# Patient Record
Sex: Male | Born: 1970 | Race: White | Hispanic: No | Marital: Married | Smoking: Never smoker
Health system: Southern US, Community
[De-identification: ages and names within clinical notes are randomized; demographics above are authoritative.]

## PROBLEM LIST (undated history)

## (undated) DIAGNOSIS — E7800 Pure hypercholesterolemia, unspecified: Secondary | ICD-10-CM

## (undated) DIAGNOSIS — Z789 Other specified health status: Secondary | ICD-10-CM

## (undated) HISTORY — PX: VASECTOMY: SHX75

## (undated) HISTORY — DX: Pure hypercholesterolemia, unspecified: E78.00

## (undated) HISTORY — PX: HERNIA REPAIR: SHX51

## (undated) HISTORY — DX: Other specified health status: Z78.9

---

## 2016-08-26 ENCOUNTER — Ambulatory Visit (INDEPENDENT_AMBULATORY_CARE_PROVIDER_SITE_OTHER): Payer: BC Managed Care – PPO

## 2016-08-26 ENCOUNTER — Ambulatory Visit (INDEPENDENT_AMBULATORY_CARE_PROVIDER_SITE_OTHER): Payer: BC Managed Care – PPO | Admitting: Vascular Surgery

## 2016-08-26 ENCOUNTER — Encounter (INDEPENDENT_AMBULATORY_CARE_PROVIDER_SITE_OTHER): Payer: Self-pay | Admitting: Vascular Surgery

## 2016-08-26 ENCOUNTER — Other Ambulatory Visit (INDEPENDENT_AMBULATORY_CARE_PROVIDER_SITE_OTHER): Payer: Self-pay | Admitting: Vascular Surgery

## 2016-08-26 ENCOUNTER — Encounter (INDEPENDENT_AMBULATORY_CARE_PROVIDER_SITE_OTHER): Payer: Self-pay

## 2016-08-26 DIAGNOSIS — I83893 Varicose veins of bilateral lower extremities with other complications: Secondary | ICD-10-CM

## 2016-08-26 DIAGNOSIS — M7989 Other specified soft tissue disorders: Secondary | ICD-10-CM

## 2016-08-26 DIAGNOSIS — I83813 Varicose veins of bilateral lower extremities with pain: Secondary | ICD-10-CM | POA: Diagnosis not present

## 2016-08-26 NOTE — Patient Instructions (Signed)

## 2016-08-26 NOTE — Progress Notes (Signed)
Patient ID: Alex Peterson, male   DOB: 09-May-1971, 45 y.o.   MRN: [id number]  Chief Complaint  Patient presents with  . Follow-up    HPI Alex Peterson is a 45 y.o. male.  Patient returns in follow up of their venous disease.  They have done their best to comply with the prescribed conservative therapies of compression stockings, leg elevation, exercise, and still requires anti-inflammatories for discomfort and has symptoms that are persistent and bothersome on a daily basis, affecting their activities of daily living and normal activities.  The venous reflux study demonstrates bilateral Ehlert segment great saphenous vein reflux as well as large branches of the great saphenous vein with reflux.Marland Kitchen   HPI  History reviewed. No pertinent past medical history.  Past Surgical History:  Procedure Laterality Date  . HERNIA REPAIR    . VASECTOMY      Family History  Problem Relation Age of Onset  . Varicose Veins Mother   . Heart disease Father      Social History Social History  Substance Use Topics  . Smoking status: Never Smoker  . Smokeless tobacco: Never Used  . Alcohol use Yes   Married  No Known Allergies  No current outpatient prescriptions on file.   No current facility-administered medications for this visit.       REVIEW OF SYSTEMS (Negative unless checked)  Constitutional: [] Weight loss  [] Fever  [] Chills Cardiac: [] Chest pain   [] Chest pressure   [] Palpitations   [] Shortness of breath when laying flat   [] Shortness of breath at rest   [] Shortness of breath with exertion. Vascular:  [] Pain in legs with walking   [] Pain in legs at rest   [] Pain in legs when laying flat   [] Claudication   [] Pain in feet when walking  [] Pain in feet at rest  [] Pain in feet when laying flat   [] History of DVT   [] Phlebitis   [] Swelling in legs   [x] Varicose veins   [] Non-healing ulcers Pulmonary:   [] Uses home oxygen   [] Productive cough   [] Hemoptysis   [] Wheeze  [] COPD    [] Asthma Neurologic:  [] Dizziness  [] Blackouts   [] Seizures   [] History of stroke   [] History of TIA  [] Aphasia   [] Temporary blindness   [] Dysphagia   [] Weakness or numbness in arms   [] Weakness or numbness in legs Musculoskeletal:  [] Arthritis   [] Joint swelling   [] Joint pain   [] Low back pain Hematologic:  [] Easy bruising  [] Easy bleeding   [] Hypercoagulable state   [] Anemic  [] Hepatitis Gastrointestinal:  [] Blood in stool   [] Vomiting blood  [] Gastroesophageal reflux/heartburn   [] Abdominal pain Genitourinary:  [] Chronic kidney disease   [] Difficult urination  [] Frequent urination  [] Burning with urination   [] Hematuria Skin:  [] Rashes   [] Ulcers   [] Wounds Psychological:  [] History of anxiety   []  History of major depression.    Physical Exam BP 126/83   Pulse 74   Resp 17   Ht 5\' 11"  (1.803 m)   Wt 204 lb (92.5 kg)   BMI 28.45 kg/m  Gen:  WD/WN, NAD Head: [place]/AT, No temporalis wasting.  Ear/Nose/Throat: Hearing grossly intact, dentition good Eyes: Sclera non-icteric. Conjunctiva clear Neck: Supple. Trachea midline Pulmonary:  Good air movement, no use of accessory muscles, respirations not labored.  Cardiac: RRR, No JVD Vascular: Varicosities diffuse and measuring up to 5-6 mm in the right lower extremity        Varicosities diffuse and measuring up to 5-6 mm  in the left lower extremity Vessel Right Left  Radial Palpable Palpable  Ulnar Palpable Palpable  Brachial Palpable Palpable  Carotid Palpable, without bruit Palpable, without bruit  Aorta Not palpable N/A  Femoral Palpable Palpable  Popliteal Palpable Palpable  PT Palpable Palpable  DP Palpable Palpable   Gastrointestinal: soft, non-tender/non-distended. No guarding/reflex. No masses, surgical incisions, or scars. Musculoskeletal: M/S 5/5 throughout.   Trace RLE edema.  Trace LLE edema Neurologic: Sensation grossly intact in extremities.  Symmetrical.  Speech is fluent.  Psychiatric: Judgment intact, Mood &  affect appropriate for pt's clinical situation. Dermatologic: No rashes or ulcers noted.  No cellulitis or open wounds. Lymph : No Cervical, Axillary, or Inguinal lymphadenopathy.  Radiology No results found.  Labs No results found for this or any previous visit (from the past 2160 hour(s)).  Assessment/Plan:  Varicose veins of bilateral lower extremities with pain Failed conservative therapy.  See treatment plan as below   The patient has done their best to comply with conservative therapy of 20-30 mm Hg compression stockings, leg elevation, exercise, and anti-inflammatories as needed for discomfort.  Despite this, they continue to have daily and persistent symptoms from their venous disease.  A venous reflux study demonstrates bilateral Mcpherson segment great saphenous vein reflux as well as large branches of the great saphenous vein with reflux.  As such, the patient is likely to benefit from endovenous laser ablation of the great saphenous vein bilaterally. It is likely that foam sclerotherapy will be required for the large incompetent saphenous vein branches, but laser ablation should be performed first.  Risks and benefits of the procedure including bleeding, infection, recanalization, DVT, and need for further therapy for residual varicosities were discussed.  The patient voices their understanding and is agreeable to proceed with staged bilateral great saphenous vein laser ablation.     Leotis Pain 08/26/2016, 3:29 PM

## 2016-08-26 NOTE — Assessment & Plan Note (Signed)
Failed conservative therapy.  See treatment plan as below

## 2016-10-21 ENCOUNTER — Ambulatory Visit (INDEPENDENT_AMBULATORY_CARE_PROVIDER_SITE_OTHER): Payer: BC Managed Care – PPO | Admitting: Vascular Surgery

## 2016-10-21 ENCOUNTER — Encounter (INDEPENDENT_AMBULATORY_CARE_PROVIDER_SITE_OTHER): Payer: Self-pay | Admitting: Vascular Surgery

## 2016-10-21 VITALS — BP 121/74 | HR 80 | Resp 16 | Ht 71.0 in | Wt 208.0 lb

## 2016-10-21 DIAGNOSIS — I83813 Varicose veins of bilateral lower extremities with pain: Secondary | ICD-10-CM | POA: Diagnosis not present

## 2016-10-21 NOTE — Progress Notes (Signed)
The patient's left lower extremity was sterilely prepped and draped. The ultrasound machine was used to visualize the saphenous vein throughout its course. A segment in the upper calf was selected for access. The saphenous vein was accessed without difficulty using ultrasound guidance with a micro puncture needle. A micro puncture wire and sheath were then placed. A 0.018 wire was placed beyond the saphenofemoral junction through the sheath and the micro puncture sheath was removed. The 65 cm sheath was then placed over the wire and the wire and dilator were removed. The laser fiber was placed through the sheath and its tip was placed approximately 4 cm below the saphenofemoral junction. Tumescent anesthesia was then created with a dilute lidocaine solution. Laser energy was then delivered with constant withdrawal of the sheath and laser fiber. Approximately 1623 Joules of energy were delivered over a length of 43 cm using the 1470 Hz VenaCure machine at [employer]. Sterile dressings were placed. The patient tolerated the procedure well without complications.

## 2016-10-21 NOTE — Assessment & Plan Note (Signed)
Laser procedure left leg as described below.  Has right GSV EVLT scheduled for the near future.

## 2016-10-24 ENCOUNTER — Encounter (INDEPENDENT_AMBULATORY_CARE_PROVIDER_SITE_OTHER): Payer: Self-pay | Admitting: Vascular Surgery

## 2016-10-24 ENCOUNTER — Other Ambulatory Visit (INDEPENDENT_AMBULATORY_CARE_PROVIDER_SITE_OTHER): Payer: Self-pay | Admitting: Vascular Surgery

## 2016-10-24 ENCOUNTER — Ambulatory Visit (INDEPENDENT_AMBULATORY_CARE_PROVIDER_SITE_OTHER): Payer: BC Managed Care – PPO | Admitting: Vascular Surgery

## 2016-10-24 VITALS — BP 133/72 | HR 72 | Resp 16 | Ht 71.0 in | Wt 208.0 lb

## 2016-10-24 DIAGNOSIS — L97919 Non-pressure chronic ulcer of unspecified part of right lower leg with unspecified severity: Principal | ICD-10-CM

## 2016-10-24 DIAGNOSIS — I83219 Varicose veins of right lower extremity with both ulcer of unspecified site and inflammation: Secondary | ICD-10-CM

## 2016-10-24 DIAGNOSIS — L97929 Non-pressure chronic ulcer of unspecified part of left lower leg with unspecified severity: Secondary | ICD-10-CM

## 2016-10-24 DIAGNOSIS — I83813 Varicose veins of bilateral lower extremities with pain: Secondary | ICD-10-CM

## 2016-10-24 DIAGNOSIS — I83229 Varicose veins of left lower extremity with both ulcer of unspecified site and inflammation: Secondary | ICD-10-CM

## 2016-10-24 NOTE — Assessment & Plan Note (Signed)
See laser note 

## 2016-10-24 NOTE — Progress Notes (Signed)
The patient's right lower extremity was sterilely prepped and draped. The ultrasound machine was used to visualize the saphenous vein throughout its course. A segment at the knee was selected for access. The saphenous vein was accessed with minimal difficulty using ultrasound guidance with a micro puncture needle. A micro puncture wire and sheath were then placed. A 0.018 wire was placed beyond the saphenofemoral junction through the sheath and the micro puncture sheath was removed. The 65 cm sheath was then placed over the wire and the wire and dilator were removed. The laser fiber was placed through the sheath and its tip was placed approximately 4 cm below the saphenofemoral junction. Tumescent anesthesia was then created with a dilute lidocaine solution. Laser energy was then delivered with constant withdrawal of the sheath and laser fiber. Approximately 1171 Joules of energy were delivered over a length of 30 cm using the 1470 Hz VenaCure machine at [employer]. Sterile dressings were placed. The patient tolerated the procedure well without complications.

## 2016-10-28 ENCOUNTER — Ambulatory Visit (INDEPENDENT_AMBULATORY_CARE_PROVIDER_SITE_OTHER): Payer: BC Managed Care – PPO

## 2016-10-28 DIAGNOSIS — L97929 Non-pressure chronic ulcer of unspecified part of left lower leg with unspecified severity: Secondary | ICD-10-CM

## 2016-10-28 DIAGNOSIS — L97919 Non-pressure chronic ulcer of unspecified part of right lower leg with unspecified severity: Principal | ICD-10-CM

## 2016-10-28 DIAGNOSIS — I83219 Varicose veins of right lower extremity with both ulcer of unspecified site and inflammation: Secondary | ICD-10-CM | POA: Diagnosis not present

## 2016-10-28 DIAGNOSIS — I83229 Varicose veins of left lower extremity with both ulcer of unspecified site and inflammation: Secondary | ICD-10-CM

## 2016-11-28 ENCOUNTER — Ambulatory Visit (INDEPENDENT_AMBULATORY_CARE_PROVIDER_SITE_OTHER): Payer: BC Managed Care – PPO | Admitting: Vascular Surgery

## 2016-11-28 ENCOUNTER — Encounter (INDEPENDENT_AMBULATORY_CARE_PROVIDER_SITE_OTHER): Payer: Self-pay | Admitting: Vascular Surgery

## 2016-11-28 VITALS — BP 123/76 | HR 82 | Resp 16 | Ht 71.0 in | Wt 212.0 lb

## 2016-11-28 DIAGNOSIS — I83813 Varicose veins of bilateral lower extremities with pain: Secondary | ICD-10-CM | POA: Diagnosis not present

## 2016-11-28 NOTE — Progress Notes (Signed)
MRN : [id number]  Alex Peterson is a 46 y.o. (1971/08/15) male who presents with chief complaint of  Chief Complaint  Patient presents with  . Follow-up  .  History of Present Illness: Patient returns today in follow up of Venous insufficiency. He is undergone bilateral saphenous vein laser ablations with improvement but not resolution of his lower extremity symptoms. He has had a marked reduction in the size and pain of the varicose veins although significant varicosities do remain bilaterally. He had no periprocedural complications. He is about a month out from his procedure, and is doing well today.  No current outpatient prescriptions on file.   No current facility-administered medications for this visit.     History reviewed. No pertinent past medical history.       Past Surgical History:  Procedure Laterality Date  . HERNIA REPAIR    . VASECTOMY           Family History  Problem Relation Age of Onset  . Varicose Veins Mother   . Heart disease Father      Social History     Social History  Substance Use Topics  . Smoking status: Never Smoker  . Smokeless tobacco: Never Used  . Alcohol use Yes   Married  No Known Allergies  No current outpatient prescriptions on file.   No current facility-administered medications for this visit.       REVIEW OF SYSTEMS (Negative unless checked)  Constitutional: [] Weight loss  [] Fever  [] Chills Cardiac: [] Chest pain   [] Chest pressure   [] Palpitations   [] Shortness of breath when laying flat   [] Shortness of breath at rest   [] Shortness of breath with exertion. Vascular:  [] Pain in legs with walking   [] Pain in legs at rest   [] Pain in legs when laying flat   [] Claudication   [] Pain in feet when walking  [] Pain in feet at rest  [] Pain in feet when laying flat   [] History of DVT   [] Phlebitis   [] Swelling in legs   [x] Varicose veins   [] Non-healing ulcers Pulmonary:   [] Uses home oxygen   [] Productive  cough   [] Hemoptysis   [] Wheeze  [] COPD   [] Asthma Neurologic:  [] Dizziness  [] Blackouts   [] Seizures   [] History of stroke   [] History of TIA  [] Aphasia   [] Temporary blindness   [] Dysphagia   [] Weakness or numbness in arms   [] Weakness or numbness in legs Musculoskeletal:  [] Arthritis   [] Joint swelling   [] Joint pain   [] Low back pain Hematologic:  [] Easy bruising  [] Easy bleeding   [] Hypercoagulable state   [] Anemic  [] Hepatitis Gastrointestinal:  [] Blood in stool   [] Vomiting blood  [] Gastroesophageal reflux/heartburn   [] Abdominal pain Genitourinary:  [] Chronic kidney disease   [] Difficult urination  [] Frequent urination  [] Burning with urination   [] Hematuria Skin:  [] Rashes   [] Ulcers   [] Wounds Psychological:  [] History of anxiety   []  History of major depression.    Physical Exam BP 126/83   Pulse 74   Resp 17   Ht 5\' 11"  (1.803 m)   Wt 204 lb (92.5 kg)   BMI 28.45 kg/m  Gen:  WD/WN, NAD Head: [place]/AT, No temporalis wasting.  Ear/Nose/Throat: Hearing grossly intact, dentition good Eyes: Sclera non-icteric. Conjunctiva clear Neck: Supple. Trachea midline Pulmonary:  Good air movement, no use of accessory muscles, respirations not labored.  Cardiac: RRR, No JVD Vascular: Varicosities now less prominent and measuring up to 3  mm in the  right lower extremity                   Varicosities now less prominent  and measuring up to 4  mm in the left lower extremity Vessel Right Left  Radial Palpable Palpable  Ulnar Palpable Palpable  Brachial Palpable Palpable  Carotid Palpable, without bruit Palpable, without bruit  Aorta Not palpable N/A  Femoral Palpable Palpable  Popliteal Palpable Palpable  PT Palpable Palpable  DP Palpable Palpable   Gastrointestinal: soft, non-tender/non-distended. No guarding/reflex. No masses, surgical incisions, or scars. Musculoskeletal: M/S 5/5 throughout.   Trace RLE edema.  Trace LLE edema Neurologic: Sensation grossly intact in extremities.   Symmetrical.  Speech is fluent.  Psychiatric: Judgment intact, Mood & affect appropriate for pt's clinical situation. Dermatologic: No rashes or ulcers noted.  No cellulitis or open wounds. Lymph : No Cervical, Axillary, or Inguinal lymphadenopathy.     Labs No results found for this or any previous visit (from the past 2160 hour(s)).  Radiology No results found.    Assessment/Plan  Varicose veins of bilateral lower extremities with pain Recommend:  The patient has had successful ablation of the previously incompetent saphenous venous system but still has persistent symptoms of pain and swelling that are having a negative impact on daily life and daily activities.  Patient should undergo injection sclerotherapy to treat the residual varicosities.  The risks, benefits and alternative therapies were reviewed in detail with the patient.  All questions were answered.  The patient agrees to proceed with sclerotherapy at their convenience.  The patient will continue wearing the graduated compression stockings and using the over-the-counter pain medications to treat her symptoms.         Leotis Pain, MD  11/28/2016 11:33 AM    This note was created with Dragon medical transcription system.  Any errors from dictation are purely unintentional

## 2016-11-28 NOTE — Patient Instructions (Signed)
Varicose Vein Surgery Varicose vein surgery is a procedure to remove or repair varicose veins. These are swollen, twisted veins that are visible under the skin, especially in your legs. These veins may appear blue and bulging. All veins have a valve that keeps blood flowing in only one direction. If these valves get weak or damaged, blood can pool and cause varicose veins. You may have varicose vein surgery if your varicose veins are causing symptoms or complications, or if lifestyle changes have not helped. The surgery can reduce pain, aching, and the risk of bleeding and blood clots. Surgery can also improve the way the affected area looks (cosmetic appearance). The different types of varicose vein surgery include:  Injecting a chemical to close off a vein (sclerotherapy).  Treating a vein with light energy (laser treatment).  Using lasers or radio waves to close off a vein with heat (radiofrequency vein ablation).  Surgically removing the vein through a small incision (phlebectomy).  Surgically removing the vein through incisions after the vein has been tied off (vein ligation and stripping). Your health care provider will discuss the method that is best for you based on your condition. Tell a health care provider about:  Any allergies you have.  All medicines you are taking, including vitamins, herbs, eye drops, creams, and over-the-counter medicines.  Any problems you or family members have had with anesthetic medicines.  Any blood disorders you have.  Any surgeries you have had.  Any medical conditions you have. What are the risks? Generally, this is a safe procedure. However, problems can occur and include:  Damage to nearby nerves, tissues, or veins.  Sores.  Dark spots.  Skin irritation.  Numbness.  Clotting.  Infection.  Scarring.  Leg swelling.  Need for additional treatments. What happens before the procedure?  Ask your health care provider  about:  Changing or stopping your regular medicines. This is especially important if you are taking diabetes medicines or blood thinners.  Taking medicines such as aspirin and ibuprofen. These medicines can thin your blood. Do not take these medicines before your procedure if your health care provider instructs you not to.  You may have tests before varicose vein surgery. These can include a test to:  Check for clots and check blood flow using sound waves (Doppler ultrasound).  Observe how blood flows through your veins by injecting a dye that outlines your veins on X-rays (angiogram). This test is used in rare cases. What happens during the procedure? The procedure will vary depending on which type of varicose vein surgery you have: Sclerotherapy  This procedure is often used for small to medium veins.  A chemical (sclerosant) that irritates the lining of the vein will be injected into the vein. This will cause the varicose vein to be closed off.  Sclerosants in different amounts and strengths can be used, depending on the size and location of the vein.  You may need more than one treatment. Laser Treatment  This procedure does not involve incisions or chemicals.  Light energy from a laser will be directed onto the vein.  Laser treatment may be combined with sclerotherapy.  You may need more than one treatment. Radiofrequency Vein Ablation  You will be given a medicine that numbs the area (local anesthetic).  A small surgical cut (incision) will be made near the varicose vein.  A narrow tube (catheter) will be threaded into your vein.  The tip of the catheter will use either a laser or radio waves   to close the vein with heat. Phlebectomy  This surgical procedure is used to remove the veins closest to the skin.  You will be given a medicine that numbs the area (local anesthetic).  The surgeon will make a small puncture close to the varicose vein and then use a tiny hook  to pull out the vein. Vein Ligation and Stripping  This surgical procedure is used to treat severe cases.  For this procedure, you will be given a medicine that makes you go to sleep (general anesthetic).  The surgeon will make a small incision near the vein in your groin (saphenous vein).  First the surgeon will tie off (ligate) the vein.  Then the surgeon will make several more incisions along the vein.  The vein will be removed (stripped).  It may take 1-4 weeks to recover completely. What happens after the procedure?  Depending on the type of procedure that is performed, you may be able to return to your usual activities the day after the procedure.  You may have to wear compression stockings. These stockings help to prevent blood clots and reduce swelling in your legs. This information is not intended to replace advice given to you by your health care provider. Make sure you discuss any questions you have with your health care provider. Document Released: 11/09/2007 Document Revised: 03/27/2016 Document Reviewed: 03/29/2014 Elsevier Interactive Patient Education  2017 [employer]  

## 2016-11-28 NOTE — Assessment & Plan Note (Signed)
Recommend:  The patient has had successful ablation of the previously incompetent saphenous venous system but still has persistent symptoms of pain and swelling that are having a negative impact on daily life and daily activities.  Patient should undergo injection sclerotherapy to treat the residual varicosities.  The risks, benefits and alternative therapies were reviewed in detail with the patient.  All questions were answered.  The patient agrees to proceed with sclerotherapy at their convenience.  The patient will continue wearing the graduated compression stockings and using the over-the-counter pain medications to treat her symptoms.     

## 2016-12-22 ENCOUNTER — Ambulatory Visit (INDEPENDENT_AMBULATORY_CARE_PROVIDER_SITE_OTHER): Payer: BC Managed Care – PPO | Admitting: Vascular Surgery

## 2017-01-05 ENCOUNTER — Encounter (INDEPENDENT_AMBULATORY_CARE_PROVIDER_SITE_OTHER): Payer: Self-pay

## 2017-01-05 ENCOUNTER — Ambulatory Visit (INDEPENDENT_AMBULATORY_CARE_PROVIDER_SITE_OTHER): Payer: BC Managed Care – PPO | Admitting: Vascular Surgery

## 2017-01-05 ENCOUNTER — Encounter (INDEPENDENT_AMBULATORY_CARE_PROVIDER_SITE_OTHER): Payer: Self-pay | Admitting: Vascular Surgery

## 2017-01-05 VITALS — BP 136/78 | HR 76 | Resp 17 | Wt 213.0 lb

## 2017-01-05 DIAGNOSIS — I83813 Varicose veins of bilateral lower extremities with pain: Secondary | ICD-10-CM

## 2017-01-05 NOTE — Progress Notes (Signed)
Varicose veins of left lower extremity with inflammation (454.1  I83.10) Current Plans  Indication: Patient presents with symptomatic varicose veins of the leftlower extremity.  Procedure: Sclerotherapy using hypertonic saline mixed with 1% Lidocaine was performed on the leftlower extremity. Compression wraps were placed. The patient tolerated the procedure well.  Plan: Follow up as needed. 

## 2017-01-26 ENCOUNTER — Encounter (INDEPENDENT_AMBULATORY_CARE_PROVIDER_SITE_OTHER): Payer: Self-pay | Admitting: Vascular Surgery

## 2017-01-26 ENCOUNTER — Ambulatory Visit (INDEPENDENT_AMBULATORY_CARE_PROVIDER_SITE_OTHER): Payer: BC Managed Care – PPO | Admitting: Vascular Surgery

## 2017-01-26 VITALS — BP 132/81 | HR 79 | Resp 16 | Ht 71.0 in | Wt 212.0 lb

## 2017-01-26 DIAGNOSIS — I83813 Varicose veins of bilateral lower extremities with pain: Secondary | ICD-10-CM

## 2017-01-26 NOTE — Progress Notes (Signed)
Varicose veins of left lower extremity with inflammation (454.1  I83.10) Current Plans   Indication: Patient presents with symptomatic varicose veins of the left lower extremity.   Procedure: Sclerotherapy using hypertonic saline mixed with 1% Lidocaine was performed on the left lower extremity. Compression wraps were placed. The patient tolerated the procedure well. 

## 2017-02-16 ENCOUNTER — Encounter (INDEPENDENT_AMBULATORY_CARE_PROVIDER_SITE_OTHER): Payer: Self-pay | Admitting: Vascular Surgery

## 2017-02-16 ENCOUNTER — Ambulatory Visit (INDEPENDENT_AMBULATORY_CARE_PROVIDER_SITE_OTHER): Payer: BC Managed Care – PPO | Admitting: Vascular Surgery

## 2017-02-16 VITALS — BP 155/88 | HR 64 | Resp 16 | Ht 71.0 in | Wt 219.0 lb

## 2017-02-16 DIAGNOSIS — I83813 Varicose veins of bilateral lower extremities with pain: Secondary | ICD-10-CM

## 2017-02-16 NOTE — Progress Notes (Signed)
Varicose veins of left lower extremity with inflammation (454.1  I83.10) Current Plans   Indication: Patient presents with symptomatic varicose veins of the left lower extremity.   Procedure: Sclerotherapy using hypertonic saline mixed with 1% Lidocaine was performed on the left lower extremity. Compression wraps were placed. The patient tolerated the procedure well. 

## 2017-03-09 ENCOUNTER — Ambulatory Visit (INDEPENDENT_AMBULATORY_CARE_PROVIDER_SITE_OTHER): Payer: BC Managed Care – PPO | Admitting: Vascular Surgery

## 2017-03-09 ENCOUNTER — Encounter (INDEPENDENT_AMBULATORY_CARE_PROVIDER_SITE_OTHER): Payer: Self-pay | Admitting: Vascular Surgery

## 2017-03-09 VITALS — BP 138/82 | HR 80 | Resp 16 | Wt 211.0 lb

## 2017-03-09 DIAGNOSIS — I83819 Varicose veins of unspecified lower extremities with pain: Secondary | ICD-10-CM | POA: Insufficient documentation

## 2017-03-31 ENCOUNTER — Ambulatory Visit (INDEPENDENT_AMBULATORY_CARE_PROVIDER_SITE_OTHER): Payer: BC Managed Care – PPO | Admitting: Vascular Surgery

## 2017-03-31 ENCOUNTER — Encounter (INDEPENDENT_AMBULATORY_CARE_PROVIDER_SITE_OTHER): Payer: Self-pay | Admitting: Vascular Surgery

## 2017-03-31 VITALS — BP 135/81 | HR 84 | Resp 17 | Ht 71.0 in | Wt 215.0 lb

## 2017-03-31 DIAGNOSIS — I83813 Varicose veins of bilateral lower extremities with pain: Secondary | ICD-10-CM | POA: Diagnosis not present

## 2017-03-31 NOTE — Progress Notes (Signed)
Varicose veins of left lower extremity with inflammation (454.1  I83.10) Current Plans   Indication: Patient presents with symptomatic varicose veins of the left lower extremity.   Procedure: Sclerotherapy using hypertonic saline mixed with 1% Lidocaine was performed on the left lower extremity. Compression wraps were placed. The patient tolerated the procedure well. 

## 2017-04-13 ENCOUNTER — Ambulatory Visit (INDEPENDENT_AMBULATORY_CARE_PROVIDER_SITE_OTHER): Payer: BC Managed Care – PPO | Admitting: Vascular Surgery

## 2017-04-14 ENCOUNTER — Ambulatory Visit (INDEPENDENT_AMBULATORY_CARE_PROVIDER_SITE_OTHER): Payer: BC Managed Care – PPO | Admitting: Vascular Surgery

## 2017-04-16 ENCOUNTER — Encounter (INDEPENDENT_AMBULATORY_CARE_PROVIDER_SITE_OTHER): Payer: Self-pay | Admitting: Vascular Surgery

## 2017-04-16 ENCOUNTER — Ambulatory Visit (INDEPENDENT_AMBULATORY_CARE_PROVIDER_SITE_OTHER): Payer: BC Managed Care – PPO | Admitting: Vascular Surgery

## 2017-04-16 VITALS — BP 138/76 | HR 76 | Resp 15 | Ht 71.0 in | Wt 212.0 lb

## 2017-04-16 DIAGNOSIS — I83813 Varicose veins of bilateral lower extremities with pain: Secondary | ICD-10-CM | POA: Diagnosis not present

## 2017-04-16 NOTE — Progress Notes (Signed)
Varicose veins of bilateral  lower extremity with inflammation (454.1  I83.10) Current Plans   Indication: Patient presents with symptomatic varicose veins of the bilateral  lower extremity.   Procedure: Sclerotherapy using hypertonic saline mixed with 1% Lidocaine was performed on the bilateral lower extremity. Compression wraps were placed. The patient tolerated the procedure well. 

## 2017-04-20 ENCOUNTER — Ambulatory Visit (INDEPENDENT_AMBULATORY_CARE_PROVIDER_SITE_OTHER): Payer: BC Managed Care – PPO | Admitting: Vascular Surgery

## 2021-01-30 ENCOUNTER — Other Ambulatory Visit: Payer: Self-pay

## 2021-01-30 ENCOUNTER — Telehealth: Payer: Self-pay | Admitting: Internal Medicine

## 2021-01-30 ENCOUNTER — Encounter: Payer: Self-pay | Admitting: Internal Medicine

## 2021-01-30 ENCOUNTER — Ambulatory Visit: Payer: BC Managed Care – PPO | Admitting: Internal Medicine

## 2021-01-30 DIAGNOSIS — Z1211 Encounter for screening for malignant neoplasm of colon: Secondary | ICD-10-CM

## 2021-01-30 DIAGNOSIS — Z125 Encounter for screening for malignant neoplasm of prostate: Secondary | ICD-10-CM

## 2021-01-30 DIAGNOSIS — T7840XA Allergy, unspecified, initial encounter: Secondary | ICD-10-CM

## 2021-01-30 DIAGNOSIS — Z13220 Encounter for screening for lipoid disorders: Secondary | ICD-10-CM | POA: Diagnosis not present

## 2021-01-30 NOTE — Progress Notes (Signed)
Patient ID: Alex Peterson, male   DOB: 1971-04-06, 50 y.o.   MRN: [id number]   Subjective:    Patient ID: Alex Peterson, male    DOB: 01/06/1971, 50 y.o.   MRN: [id number]  HPI This visit occurred during the SARS-CoV-2 public health emergency.  Safety protocols were in place, including screening questions prior to the visit, additional usage of staff PPE, and extensive cleaning of exam room while observing appropriate contact time as indicated for disinfecting solutions.   Patient here to establish care.  He has been healthy.  No chronic medical problems.  Denies any history of heart disease, high blood pressure or diabetes.  Takes finasteride.  No chest pain.  Plays soccer.  Breathing stable.  No sob.  No acid reflux. No abdominal pain.  Bowels moving.  Discussed colonoscopy.  Due to get labs through SBI screening.    Past Medical History:  Diagnosis Date  . No pertinent past medical history    Past Surgical History:  Procedure Laterality Date  . HERNIA REPAIR    . VASECTOMY     Family History  Problem Relation Age of Onset  . Varicose Veins Mother   . Miscarriages / [place] Mother   . Thyroid disease Mother   . Heart disease Father   . Stroke Father   . Thyroid disease Sister   . Thyroid disease Sister   . Cancer Sister        ovarian cancer  . Heart attack Maternal Grandmother   . Thyroid disease Maternal Grandmother   . Cancer Maternal Grandfather        liver cancer  . Heart attack Paternal Grandmother    Social History   Socioeconomic History  . Marital status: Married    Spouse name: Not on file  . Number of children: 3  . Years of education: Not on file  . Highest education level: Not on file  Occupational History  . Not on file  Tobacco Use  . Smoking status: Never Smoker  . Smokeless tobacco: Never Used  Substance and Sexual Activity  . Alcohol use: Yes    Comment: occasional  . Drug use: No  . Sexual activity: Yes  Other Topics Concern  . Not on file   Social History Narrative  . Not on file   Social Determinants of Health   Financial Resource Strain: Not on file  Food Insecurity: Not on file  Transportation Needs: Not on file  Physical Activity: Not on file  Stress: Not on file  Social Connections: Not on file    Outpatient Encounter Medications as of 01/30/2021  Medication Sig  . finasteride (PROPECIA) 1 MG tablet Take 1 mg by mouth daily.   No facility-administered encounter medications on file as of 01/30/2021.    Review of Systems  Constitutional: Negative for appetite change and unexpected weight change.  HENT: Positive for postnasal drip. Negative for congestion and sinus pressure.   Respiratory: Negative for cough, chest tightness and shortness of breath.   Cardiovascular: Negative for chest pain, palpitations and leg swelling.  Gastrointestinal: Negative for abdominal pain, diarrhea, nausea and vomiting.  Genitourinary: Negative for difficulty urinating and dysuria.  Musculoskeletal: Negative for joint swelling and myalgias.  Skin: Negative for color change and rash.  Neurological: Negative for dizziness, light-headedness and headaches.  Psychiatric/Behavioral: Negative for agitation and dysphoric mood.       Objective:    Physical Exam Vitals reviewed.  Constitutional:      General: He is not  in acute distress.    Appearance: Normal appearance. He is well-developed.  HENT:     Head: Normocephalic and atraumatic.     Right Ear: External ear normal.     Left Ear: External ear normal.  Eyes:     General: No scleral icterus.       Right eye: No discharge.        Left eye: No discharge.     Conjunctiva/sclera: Conjunctivae normal.  Cardiovascular:     Rate and Rhythm: Normal rate and regular rhythm.  Pulmonary:     Effort: Pulmonary effort is normal. No respiratory distress.     Breath sounds: Normal breath sounds.  Abdominal:     General: Bowel sounds are normal.     Palpations: Abdomen is soft.      Tenderness: There is no abdominal tenderness.  Musculoskeletal:        General: No swelling or tenderness.     Cervical back: Neck supple. No tenderness.  Lymphadenopathy:     Cervical: No cervical adenopathy.  Skin:    Findings: No erythema or rash.  Neurological:     Mental Status: He is alert.  Psychiatric:        Mood and Affect: Mood normal.        Behavior: Behavior normal.     BP 136/82   Pulse 82   Temp (!) 97.4 F (36.3 C) (Oral)   Resp 16   Ht 5\' 11"  (1.803 m)   Wt 222 lb (100.7 kg)   SpO2 98%   BMI 30.96 kg/m  Wt Readings from Last 3 Encounters:  01/30/21 222 lb (100.7 kg)  04/16/17 212 lb (96.2 kg)  03/31/17 215 lb (97.5 kg)       Assessment & Plan:   Problem List Items Addressed This Visit    Allergies    nasacort nasal spray as directed.  Follow.       Colon cancer screening    Discussed new guidelines and due colonoscopy. Notify me when agreeable.        Prostate cancer screening    He is due to have labs drawn in May.  Review labs.  Check psa if not done.        Screening cholesterol level    Low cholesterol diet and exercise.  Review outside labs when available.  Check lipid panel if not performed.            Einar Pheasant, MD

## 2021-01-30 NOTE — Telephone Encounter (Signed)
Please call and notify Alex Peterson that the nasal spray is nasacort nasal spray - instructions 2 sprays each nostril one time per day (do this in the evening).

## 2021-01-31 NOTE — Telephone Encounter (Signed)
LMTCB

## 2021-01-31 NOTE — Telephone Encounter (Signed)
Patient aware.

## 2021-02-09 ENCOUNTER — Encounter: Payer: Self-pay | Admitting: Internal Medicine

## 2021-02-09 DIAGNOSIS — Z125 Encounter for screening for malignant neoplasm of prostate: Secondary | ICD-10-CM | POA: Insufficient documentation

## 2021-02-09 DIAGNOSIS — Z1211 Encounter for screening for malignant neoplasm of colon: Secondary | ICD-10-CM | POA: Insufficient documentation

## 2021-02-09 DIAGNOSIS — Z13220 Encounter for screening for lipoid disorders: Secondary | ICD-10-CM | POA: Insufficient documentation

## 2021-02-09 DIAGNOSIS — T7840XA Allergy, unspecified, initial encounter: Secondary | ICD-10-CM | POA: Insufficient documentation

## 2021-02-09 NOTE — Assessment & Plan Note (Signed)
He is due to have labs drawn in May.  Review labs.  Check psa if not done.

## 2021-02-09 NOTE — Assessment & Plan Note (Signed)
Low cholesterol diet and exercise.  Review outside labs when available.  Check lipid panel if not performed.

## 2021-02-09 NOTE — Assessment & Plan Note (Signed)
nasacort nasal spray as directed.  Follow.

## 2021-02-09 NOTE — Assessment & Plan Note (Signed)
Discussed new guidelines and due colonoscopy. Notify me when agreeable.

## 2021-03-22 ENCOUNTER — Other Ambulatory Visit: Payer: Self-pay | Admitting: Internal Medicine

## 2021-03-22 DIAGNOSIS — L089 Local infection of the skin and subcutaneous tissue, unspecified: Secondary | ICD-10-CM

## 2021-03-22 NOTE — Progress Notes (Signed)
Order placed for podiatry referral.   

## 2021-04-02 ENCOUNTER — Ambulatory Visit: Payer: BC Managed Care – PPO | Admitting: Podiatry

## 2021-04-30 ENCOUNTER — Ambulatory Visit: Payer: BC Managed Care – PPO | Admitting: Podiatry

## 2021-04-30 ENCOUNTER — Other Ambulatory Visit: Payer: Self-pay

## 2021-04-30 ENCOUNTER — Ambulatory Visit: Payer: BC Managed Care – PPO

## 2021-04-30 DIAGNOSIS — B351 Tinea unguium: Secondary | ICD-10-CM | POA: Diagnosis not present

## 2021-04-30 DIAGNOSIS — M79674 Pain in right toe(s): Secondary | ICD-10-CM

## 2021-04-30 MED ORDER — TERBINAFINE HCL 250 MG PO TABS: 250.0000 mg | ORAL_TABLET | Freq: Every day | ORAL | 0 refills | Status: DC

## 2021-04-30 MED ORDER — GENTAMICIN SULFATE 0.1 % EX CREA: 1.0000 "application " | TOPICAL_CREAM | Freq: Two times a day (BID) | CUTANEOUS | 1 refills | Status: DC

## 2021-04-30 NOTE — Progress Notes (Signed)
   Subjective: 50 y.o. male presenting today for evaluation of thickened discolored nail that is growing upwards to his right great toe.  He states that is been like this for several several months.  He believes it is possible fungal related.  He is tried over-the-counter topicals with no improvement.  He states that it is symptomatic and close toed shoes.  He plays soccer 2 times weekly.  He presents for further treatment evaluation  Past Medical History:  Diagnosis Date   No pertinent past medical history     Objective: Physical Exam General: The patient is alert and oriented x3 in no acute distress.  Dermatology: Hyperkeratotic, discolored, thickened, onychodystrophy noted to the right hallux nail plate. Skin is warm, dry and supple bilateral lower extremities. Negative for open lesions or macerations.  Vascular: Palpable pedal pulses bilaterally. No edema or erythema noted. Capillary refill within normal limits.  Neurological: Epicritic and protective threshold grossly intact bilaterally.   Musculoskeletal Exam: Range of motion within normal limits to all pedal and ankle joints bilateral. Muscle strength 5/5 in all groups bilateral.   Assessment: #1 Onychomycosis of toenail right hallux #2 Hyperkeratotic nail right hallux that appears to be growing upward  Plan of Care:  #1 Patient was evaluated. #2  Today we discussed different treatment options associated to the fungal element of the toenail as well as the pain and thickening of the toenail.  I believe it would be in the patient's best interest to perform a total temporary nail avulsion and allow a new nail to grow in.  The patient agrees.  All possible complications and details of procedure were explained. #3 also we discussed treatment of the fungal element.  We discussed different treatment options including topical, oral, and laser antifungal treatment modalities.  The patient opts for oral antifungal treatment.  He is otherwise  healthy and denies a history of liver pathology or symptoms #4 prescription for Lamisil 2 and 50 mg #90 daily   Edrick Kins, DPM [facility]  Dr. Edrick Kins, DPM    [address], [place] [address]                Office [phone]  Fax [phone]

## 2021-04-30 NOTE — Patient Instructions (Signed)

## 2021-05-08 ENCOUNTER — Telehealth: Payer: Self-pay | Admitting: *Deleted

## 2021-05-08 NOTE — Telephone Encounter (Signed)
"  I had a toenail removed last Tuesday.  I just have a question about how Strege, I need to keep it covered.  [place]'m at the beach.  I didn't know if it was smart to give it some air or what."

## 2021-05-08 NOTE — Telephone Encounter (Signed)
I spoke with patient and informed him that he can stop soaking toe now, but to keep covered with waterproof bandaid if out on the beach or in the pool.  I instructed him to let it air out in the evenings if he is sitting around not moving.  He verbalized instructions and understanding

## 2021-08-08 ENCOUNTER — Encounter: Payer: BC Managed Care – PPO | Admitting: Internal Medicine

## 2021-08-12 ENCOUNTER — Encounter: Payer: BC Managed Care – PPO | Admitting: Internal Medicine

## 2021-09-06 ENCOUNTER — Telehealth: Payer: Self-pay

## 2021-09-06 ENCOUNTER — Encounter: Payer: Self-pay | Admitting: Internal Medicine

## 2021-09-06 NOTE — Telephone Encounter (Signed)
Mychart message:  I know I don't have my appointment with you until December 10, but I was wondering if you would be able to refill a prescription for Finasteride 1MG ?  I have been taking this daily off and on for a couple of years for hair loss.  Thank you.

## 2021-09-06 NOTE — Telephone Encounter (Signed)
Are you ok with filling finasteride for him?

## 2021-09-07 MED ORDER — FINASTERIDE 1 MG PO TABS: 1.0000 mg | ORAL_TABLET | Freq: Every day | ORAL | 1 refills | Status: DC

## 2021-09-07 NOTE — Addendum Note (Signed)
Addended by: Alisa Graff on: 09/07/2021 04:22 PM   Modules accepted: Orders

## 2021-09-07 NOTE — Telephone Encounter (Signed)
Medication refilled

## 2021-09-07 NOTE — Telephone Encounter (Signed)
Rx ok'd for finasteride#30 with one refills.

## 2021-10-23 ENCOUNTER — Ambulatory Visit (INDEPENDENT_AMBULATORY_CARE_PROVIDER_SITE_OTHER): Payer: BC Managed Care – PPO | Admitting: Internal Medicine

## 2021-10-23 ENCOUNTER — Telehealth: Payer: Self-pay | Admitting: Internal Medicine

## 2021-10-23 ENCOUNTER — Other Ambulatory Visit: Payer: Self-pay

## 2021-10-23 VITALS — BP 128/78 | HR 89 | Temp 98.0°F | Resp 16 | Ht 71.0 in | Wt 225.0 lb

## 2021-10-23 DIAGNOSIS — Z0000 Encounter for general adult medical examination without abnormal findings: Secondary | ICD-10-CM | POA: Diagnosis not present

## 2021-10-23 DIAGNOSIS — Z1211 Encounter for screening for malignant neoplasm of colon: Secondary | ICD-10-CM

## 2021-10-23 DIAGNOSIS — E7800 Pure hypercholesterolemia, unspecified: Secondary | ICD-10-CM

## 2021-10-23 DIAGNOSIS — R0602 Shortness of breath: Secondary | ICD-10-CM

## 2021-10-23 DIAGNOSIS — Z13220 Encounter for screening for lipoid disorders: Secondary | ICD-10-CM | POA: Diagnosis not present

## 2021-10-23 DIAGNOSIS — Z125 Encounter for screening for malignant neoplasm of prostate: Secondary | ICD-10-CM

## 2021-10-23 DIAGNOSIS — Z136 Encounter for screening for cardiovascular disorders: Secondary | ICD-10-CM

## 2021-10-23 LAB — LIPID PANEL
Cholesterol: 279 mg/dL — ABNORMAL HIGH (ref 0–200)
HDL: 54.4 mg/dL (ref >39.00)
LDL Cholesterol: 195 mg/dL — ABNORMAL HIGH (ref 0–99)
NonHDL: 224.16
Total CHOL/HDL Ratio: 5
Triglycerides: 146 mg/dL (ref 0.0–149.0)
VLDL: 29.2 mg/dL (ref 0.0–40.0)

## 2021-10-23 LAB — COMPREHENSIVE METABOLIC PANEL
ALT: 37 U/L (ref 0–53)
AST: 20 U/L (ref 0–37)
Albumin: 4.4 g/dL (ref 3.5–5.2)
Alkaline Phosphatase: 81 U/L (ref 39–117)
BUN: 13 mg/dL (ref 6–23)
CO2: 29 mEq/L (ref 19–32)
Calcium: 9.6 mg/dL (ref 8.4–10.5)
Chloride: 98 mEq/L (ref 96–112)
Creatinine, Ser: 0.92 mg/dL (ref 0.40–1.50)
GFR: 96.98 mL/min (ref >60.00)
Glucose, Bld: 105 mg/dL — ABNORMAL HIGH (ref 70–99)
Potassium: 4.3 mEq/L (ref 3.5–5.1)
Sodium: 134 mEq/L — ABNORMAL LOW (ref 135–145)
Total Bilirubin: 0.5 mg/dL (ref 0.2–1.2)
Total Protein: 6.9 g/dL (ref 6.0–8.3)

## 2021-10-23 LAB — TSH: TSH: 1.15 u[IU]/mL (ref 0.35–5.50)

## 2021-10-23 LAB — PSA: PSA: 0.7 ng/mL (ref 0.10–4.00)

## 2021-10-23 NOTE — Progress Notes (Signed)
Patient ID: Alex Peterson, male   DOB: 1971/07/17, 50 y.o.   MRN: [id number]   Subjective:    Patient ID: Alex Peterson, male    DOB: 1971-01-15, 50 y.o.   MRN: [id number]  This visit occurred during the SARS-CoV-2 public health emergency.  Safety protocols were in place, including screening questions prior to the visit, additional usage of staff PPE, and extensive cleaning of exam room while observing appropriate contact time as indicated for disinfecting solutions.   Patient here for his physical exam.   Chief Complaint  Patient presents with   Annual Exam   .   HPI He reports he is doing relatively well.  Tries to stay active.  No chest pain or sob reported.  No increased cough or congestion reported.  No abdominal pain.  Bowels moving.  Saw podiatry.  Toe is better.  Discussed family history of heart disease.  Discussed risk factor modification.  Reviewed outside labs - from previous.  Discussed elevated cholesterol, calcium scoring and cholesterol treatment.  Due colonoscopy.     Past Medical History:  Diagnosis Date   Hypercholesterolemia    Past Surgical History:  Procedure Laterality Date   HERNIA REPAIR     VASECTOMY     Family History  Problem Relation Age of Onset   Varicose Veins Mother    Miscarriages / [place] Mother    Thyroid disease Mother    Heart disease Father    Stroke Father    Thyroid disease Sister    Thyroid disease Sister    Cancer Sister        ovarian cancer   Heart attack Maternal Grandmother    Thyroid disease Maternal Grandmother    Cancer Maternal Grandfather        liver cancer   Heart attack Paternal Grandmother    Social History   Socioeconomic History   Marital status: Married    Spouse name: Not on file   Number of children: 3   Years of education: Not on file   Highest education level: Not on file  Occupational History   Not on file  Tobacco Use   Smoking status: Never   Smokeless tobacco: Never  Substance and Sexual Activity    Alcohol use: Yes    Comment: occasional   Drug use: No   Sexual activity: Yes  Other Topics Concern   Not on file  Social History Narrative   Not on file   Social Determinants of Health   Financial Resource Strain: Not on file  Food Insecurity: Not on file  Transportation Needs: Not on file  Physical Activity: Not on file  Stress: Not on file  Social Connections: Not on file     Review of Systems  Constitutional:  Negative for appetite change and unexpected weight change.  HENT:  Negative for congestion, sinus pressure and sore throat.   Eyes:  Negative for pain and visual disturbance.  Respiratory:  Negative for cough, chest tightness and shortness of breath.   Cardiovascular:  Negative for chest pain, palpitations and leg swelling.  Gastrointestinal:  Negative for abdominal pain, diarrhea, nausea and vomiting.  Genitourinary:  Negative for difficulty urinating and dysuria.  Musculoskeletal:  Negative for joint swelling and myalgias.  Skin:  Negative for color change and rash.  Neurological:  Negative for dizziness, light-headedness and headaches.  Hematological:  Negative for adenopathy. Does not bruise/bleed easily.  Psychiatric/Behavioral:  Negative for agitation and dysphoric mood.       Objective:  BP 128/78    Pulse 89    Temp 98 F (36.7 C)    Resp 16    Ht 5\' 11"  (1.803 m)    Wt 225 lb (102.1 kg)    SpO2 98%    BMI 31.38 kg/m  Wt Readings from Last 3 Encounters:  10/23/21 225 lb (102.1 kg)  01/30/21 222 lb (100.7 kg)  04/16/17 212 lb (96.2 kg)    Physical Exam Constitutional:      General: He is not in acute distress.    Appearance: Normal appearance. He is well-developed.  HENT:     Head: Normocephalic and atraumatic.     Right Ear: External ear normal.     Left Ear: External ear normal.  Eyes:     General: No scleral icterus.       Right eye: No discharge.        Left eye: No discharge.     Conjunctiva/sclera: Conjunctivae normal.  Neck:      Thyroid: No thyromegaly.  Cardiovascular:     Rate and Rhythm: Normal rate and regular rhythm.  Pulmonary:     Effort: No respiratory distress.     Breath sounds: Normal breath sounds. No wheezing.  Abdominal:     General: Bowel sounds are normal.     Palpations: Abdomen is soft.     Tenderness: There is no abdominal tenderness.  Musculoskeletal:        General: No swelling or tenderness.     Cervical back: Neck supple. No tenderness.  Lymphadenopathy:     Cervical: No cervical adenopathy.  Skin:    Findings: No erythema or rash.  Neurological:     Mental Status: He is alert and oriented to person, place, and time.  Psychiatric:        Mood and Affect: Mood normal.        Behavior: Behavior normal.     Outpatient Encounter Medications as of 10/23/2021  Medication Sig   finasteride (PROPECIA) 1 MG tablet Take 1 tablet (1 mg total) by mouth daily.   [DISCONTINUED] gentamicin cream (GARAMYCIN) 0.1 % Apply 1 application topically 2 (two) times daily.   [DISCONTINUED] terbinafine (LAMISIL) 250 MG tablet Take 1 tablet (250 mg total) by mouth daily.   No facility-administered encounter medications on file as of 10/23/2021.         Assessment & Plan:   Problem List Items Addressed This Visit     Colon cancer screening    Due colonoscopy.  Agreeable for referral.        Relevant Orders   Ambulatory referral to Gastroenterology   Healthcare maintenance    Physical today 10/23/21.  Check psa.  Check lipid panel.  Refer to GI for evaluation - colon cancer screening.       Hypercholesterolemia    Reviewed outside labs.  Cholesterol elevated.  Recheck fasting lipid profile today.  Discussed starting cholesterol medication.        Relevant Orders   Comprehensive metabolic panel (Completed)   Lipid panel (Completed)   TSH (Completed)   Prostate cancer screening    Check psa.        Relevant Orders   PSA (Completed)   RESOLVED: Screening cholesterol level   SOB  (shortness of breath) on exertion    Reports some increased sob with increased exertion.  Feels related to not exercising recently (stamina issue).  No chest pain.  EKG - SR with no acute ischemic changes.  Discussed risk factor  modification.  Discussed calcium score.  Schedule CT.  Check cholesterol.        Relevant Orders   EKG 12-Lead (Completed)   Other Visit Diagnoses     Routine general medical examination at a health care facility    -  Primary   Encounter for screening for coronary artery disease       Relevant Orders   CT CARDIAC SCORING (SELF PAY ONLY)        Einar Pheasant, MD

## 2021-10-23 NOTE — Telephone Encounter (Signed)
Lft pt vm to call ofc to sch Ct cardiac scoring. Thanks

## 2021-10-24 ENCOUNTER — Telehealth: Payer: Self-pay

## 2021-10-24 ENCOUNTER — Other Ambulatory Visit: Payer: Self-pay | Admitting: Internal Medicine

## 2021-10-24 DIAGNOSIS — E7800 Pure hypercholesterolemia, unspecified: Secondary | ICD-10-CM

## 2021-10-24 DIAGNOSIS — R739 Hyperglycemia, unspecified: Secondary | ICD-10-CM

## 2021-10-24 DIAGNOSIS — E871 Hypo-osmolality and hyponatremia: Secondary | ICD-10-CM

## 2021-10-24 MED ORDER — ROSUVASTATIN CALCIUM 10 MG PO TABS: 10.0000 mg | ORAL_TABLET | Freq: Every day | ORAL | 1 refills | Status: DC

## 2021-10-24 NOTE — Telephone Encounter (Signed)
LMTCB in regards to lab results.  

## 2021-10-24 NOTE — Progress Notes (Signed)
Order placed for f/u labs.  

## 2021-10-24 NOTE — Telephone Encounter (Signed)
Spoke with pt and scheduled his lab appt and sent in his rx for crestor per result note message.

## 2021-10-29 ENCOUNTER — Encounter: Payer: Self-pay | Admitting: Internal Medicine

## 2021-10-29 DIAGNOSIS — Z0000 Encounter for general adult medical examination without abnormal findings: Secondary | ICD-10-CM | POA: Insufficient documentation

## 2021-10-29 NOTE — Assessment & Plan Note (Signed)
Due colonoscopy.  Agreeable for referral.

## 2021-10-29 NOTE — Assessment & Plan Note (Signed)
Physical today 10/23/21.  Check psa.  Check lipid panel.  Refer to GI for evaluation - colon cancer screening.

## 2021-10-29 NOTE — Assessment & Plan Note (Signed)
Check psa 

## 2021-10-29 NOTE — Assessment & Plan Note (Signed)
Reviewed outside labs.  Cholesterol elevated.  Recheck fasting lipid profile today.  Discussed starting cholesterol medication.

## 2021-10-29 NOTE — Assessment & Plan Note (Signed)
Reports some increased sob with increased exertion.  Feels related to not exercising recently (stamina issue).  No chest pain.  EKG - SR with no acute ischemic changes.  Discussed risk factor modification.  Discussed calcium score.  Schedule CT.  Check cholesterol.

## 2021-10-30 ENCOUNTER — Telehealth: Payer: Self-pay

## 2021-10-30 NOTE — Telephone Encounter (Signed)
CALLED PATIENT NO ANSWER LEFT VOICEMAIL FOR A CALL BACK ? ?

## 2021-11-05 ENCOUNTER — Encounter: Payer: Self-pay | Admitting: Internal Medicine

## 2021-11-06 ENCOUNTER — Telehealth: Payer: Self-pay

## 2021-11-06 ENCOUNTER — Other Ambulatory Visit: Payer: Self-pay

## 2021-11-06 NOTE — Telephone Encounter (Signed)
Patient  will call us back when he finds a ride to schedule

## 2021-11-06 NOTE — Telephone Encounter (Signed)
CALLED PATIENT NO ANSWER LEFT VOICEMAIL FOR A CALL BACK ? ?

## 2021-11-15 ENCOUNTER — Ambulatory Visit
Admission: RE | Admit: 2021-11-15 | Discharge: 2021-11-15 | Disposition: A | Discharge status: Home / Self Care | Payer: BC Managed Care – PPO | Source: Ambulatory Visit | Attending: Internal Medicine | Admitting: Internal Medicine

## 2021-11-15 ENCOUNTER — Other Ambulatory Visit: Payer: Self-pay

## 2021-11-15 DIAGNOSIS — Z136 Encounter for screening for cardiovascular disorders: Secondary | ICD-10-CM | POA: Insufficient documentation

## 2021-11-18 ENCOUNTER — Telehealth: Payer: Self-pay | Admitting: Internal Medicine

## 2021-11-18 NOTE — Telephone Encounter (Signed)
Pt returned call

## 2021-11-18 NOTE — Telephone Encounter (Signed)
Pt called stating provider called him wanted to go over ct scan results

## 2021-11-19 ENCOUNTER — Encounter: Payer: Self-pay | Admitting: Internal Medicine

## 2021-11-19 DIAGNOSIS — R918 Other nonspecific abnormal finding of lung field: Secondary | ICD-10-CM | POA: Insufficient documentation

## 2021-11-19 NOTE — Telephone Encounter (Signed)
Called and notified Alex Peterson of CT results.  Plan for f/u chest CT in one year.

## 2021-11-19 NOTE — Telephone Encounter (Signed)
Pt returning call regarding previous message.

## 2021-12-05 ENCOUNTER — Other Ambulatory Visit: Payer: Self-pay

## 2021-12-05 ENCOUNTER — Other Ambulatory Visit (INDEPENDENT_AMBULATORY_CARE_PROVIDER_SITE_OTHER): Payer: BC Managed Care – PPO

## 2021-12-05 ENCOUNTER — Telehealth: Payer: Self-pay

## 2021-12-05 DIAGNOSIS — R739 Hyperglycemia, unspecified: Secondary | ICD-10-CM | POA: Diagnosis not present

## 2021-12-05 DIAGNOSIS — Z1211 Encounter for screening for malignant neoplasm of colon: Secondary | ICD-10-CM

## 2021-12-05 DIAGNOSIS — E7800 Pure hypercholesterolemia, unspecified: Secondary | ICD-10-CM | POA: Diagnosis not present

## 2021-12-05 DIAGNOSIS — E871 Hypo-osmolality and hyponatremia: Secondary | ICD-10-CM | POA: Diagnosis not present

## 2021-12-05 LAB — HEPATIC FUNCTION PANEL
ALT: 46 U/L (ref 0–53)
AST: 27 U/L (ref 0–37)
Albumin: 4.5 g/dL (ref 3.5–5.2)
Alkaline Phosphatase: 83 U/L (ref 39–117)
Bilirubin, Direct: 0.1 mg/dL (ref 0.0–0.3)
Total Bilirubin: 0.6 mg/dL (ref 0.2–1.2)
Total Protein: 6.7 g/dL (ref 6.0–8.3)

## 2021-12-05 LAB — SODIUM: Sodium: 139 mEq/L (ref 135–145)

## 2021-12-05 LAB — HEMOGLOBIN A1C: Hgb A1c MFr Bld: 5.5 % (ref 4.6–6.5)

## 2021-12-05 MED ORDER — PEG 3350-KCL-NA BICARB-NACL 420 G PO SOLR: 4000.0000 mL | Freq: Once | ORAL | 0 refills | Status: AC

## 2021-12-05 NOTE — Telephone Encounter (Signed)
Patient is ready to schedule his colonoscopy and want a call back

## 2021-12-05 NOTE — Progress Notes (Signed)
Gastroenterology Pre-Procedure Review  Request Date: 12/25/2021 Requesting Physician: Dr. Vicente Males  PATIENT REVIEW QUESTIONS: The patient responded to the following health history questions as indicated:    1. Are you having any GI issues? no 2. Do you have a personal history of Polyps? no 3. Do you have a family history of Colon Cancer or Polyps? no 4. Diabetes Mellitus? no 5. Joint replacements in the past 12 months?no 6. Major health problems in the past 3 months?no 7. Any artificial heart valves, MVP, or defibrillator?no    MEDICATIONS & ALLERGIES:    Patient reports the following regarding taking any anticoagulation/antiplatelet therapy:   Plavix, Coumadin, Eliquis, Xarelto, Lovenox, Pradaxa, Brilinta, or Effient? no Aspirin? no  Patient confirms/reports the following medications:  Current Outpatient Medications  Medication Sig Dispense Refill   finasteride (PROPECIA) 1 MG tablet Take 1 tablet (1 mg total) by mouth daily. 30 tablet 1   rosuvastatin (CRESTOR) 10 MG tablet Take 1 tablet (10 mg total) by mouth daily. 90 tablet 1   No current facility-administered medications for this visit.    Patient confirms/reports the following allergies:  No Known Allergies  No orders of the defined types were placed in this encounter.   AUTHORIZATION INFORMATION Primary Insurance: 1D#: Group #:  Secondary Insurance: 1D#: Group #:  SCHEDULE INFORMATION: Date: 12/25/2021 Time: Location:[facility]

## 2021-12-23 ENCOUNTER — Telehealth: Payer: Self-pay

## 2021-12-23 ENCOUNTER — Telehealth: Payer: Self-pay | Admitting: Gastroenterology

## 2021-12-23 NOTE — Telephone Encounter (Signed)
PATIENT CALLED WIFE HAS COVID SO WE RESCHEDULED FOR 01/22/2022 CALLED ENDO AND SENT NEW PAPERWORK AND NEW REFERRAL TO [facility]

## 2021-12-23 NOTE — Telephone Encounter (Signed)
Patient scheduled for colonoscopy 12/25/21. Please contact patient to reschedule due to wife having covid.

## 2021-12-23 NOTE — Telephone Encounter (Signed)
Pt is calling to get advice he has colonoscpy sched on  feb 22 and wife tested positive on wedensday he tested negative and was wondering can he still have procedure.

## 2021-12-23 NOTE — Telephone Encounter (Signed)
CALLED PATIENT NO ANSWER LEFT VOICEMAIL FOR A CALL BACK ? ?

## 2021-12-24 NOTE — Telephone Encounter (Signed)
Please read telephone message from 12/23/2021. Patient had been contacted by [facility] and rescheduled his procedure to 01/22/2022.

## 2022-01-21 ENCOUNTER — Encounter: Payer: Self-pay | Admitting: Gastroenterology

## 2022-01-21 ENCOUNTER — Telehealth: Payer: Self-pay

## 2022-01-21 ENCOUNTER — Telehealth: Payer: Self-pay | Admitting: Gastroenterology

## 2022-01-21 NOTE — Telephone Encounter (Signed)
Patient has colonoscopy tomorrow and is saying he was supposed to get suprep and received something different from pharmacy.  ?

## 2022-01-21 NOTE — Telephone Encounter (Signed)
Patient called and just needed to know how to take the prep I gave him the instructions over the phone he understands and is good to go suprep was not covered by insurance ?

## 2022-01-22 ENCOUNTER — Ambulatory Visit: Payer: BC Managed Care – PPO | Admitting: Anesthesiology

## 2022-01-22 ENCOUNTER — Encounter
Admission: RE | Disposition: A | Discharge status: Home / Self Care | Payer: Self-pay | Source: Home / Self Care | Attending: Gastroenterology

## 2022-01-22 ENCOUNTER — Encounter: Payer: Self-pay | Admitting: Gastroenterology

## 2022-01-22 ENCOUNTER — Ambulatory Visit
Admission: RE | Admit: 2022-01-22 | Discharge: 2022-01-22 | Disposition: A | Discharge status: Home / Self Care | Payer: BC Managed Care – PPO | Attending: Gastroenterology | Admitting: Gastroenterology

## 2022-01-22 DIAGNOSIS — Z1211 Encounter for screening for malignant neoplasm of colon: Secondary | ICD-10-CM | POA: Diagnosis present

## 2022-01-22 DIAGNOSIS — K635 Polyp of colon: Secondary | ICD-10-CM

## 2022-01-22 DIAGNOSIS — D122 Benign neoplasm of ascending colon: Secondary | ICD-10-CM | POA: Diagnosis not present

## 2022-01-22 HISTORY — PX: COLONOSCOPY WITH PROPOFOL: SHX5780

## 2022-01-22 SURGERY — COLONOSCOPY WITH PROPOFOL
Anesthesia: General

## 2022-01-22 MED ORDER — LIDOCAINE HCL (PF) 2 % IJ SOLN
INTRAMUSCULAR | Status: AC
  Filled 2022-01-22: qty 10

## 2022-01-22 MED ORDER — LIDOCAINE HCL (PF) 2 % IJ SOLN
INTRAMUSCULAR | Status: AC
  Filled 2022-01-22: qty 20

## 2022-01-22 MED ORDER — SODIUM CHLORIDE 0.9 % IV SOLN: INTRAVENOUS | Status: DC

## 2022-01-22 MED ORDER — PROPOFOL 10 MG/ML IV BOLUS
INTRAVENOUS | Status: DC | PRN
  Administered 2022-01-22: 100 mg via INTRAVENOUS
  Administered 2022-01-22: 10 mg via INTRAVENOUS

## 2022-01-22 MED ORDER — PROPOFOL 500 MG/50ML IV EMUL
INTRAVENOUS | Status: AC
  Filled 2022-01-22: qty 200

## 2022-01-22 MED ORDER — LIDOCAINE HCL (CARDIAC) PF 100 MG/5ML IV SOSY
PREFILLED_SYRINGE | INTRAVENOUS | Status: DC | PRN
  Administered 2022-01-22: 100 mg via INTRAVENOUS

## 2022-01-22 MED ORDER — PHENYLEPHRINE HCL-NACL 20-0.9 MG/250ML-% IV SOLN
INTRAVENOUS | Status: AC
  Filled 2022-01-22: qty 250

## 2022-01-22 MED ORDER — STERILE WATER FOR IRRIGATION IR SOLN
Status: DC | PRN
  Administered 2022-01-22 (×2): 60 mL

## 2022-01-22 MED ORDER — DEXMEDETOMIDINE HCL IN NACL 80 MCG/20ML IV SOLN
INTRAVENOUS | Status: AC
  Filled 2022-01-22: qty 20

## 2022-01-22 MED ORDER — PROPOFOL 500 MG/50ML IV EMUL
INTRAVENOUS | Status: DC | PRN
  Administered 2022-01-22: 150 ug/kg/min via INTRAVENOUS

## 2022-01-22 NOTE — Anesthesia Preprocedure Evaluation (Addendum)
Anesthesia Evaluation  ?Patient identified by MRN, date of birth, ID band ?Patient awake ? ? ? ?Reviewed: ?Allergy & Precautions, NPO status , Patient's Chart, lab work & pertinent test results ? ?History of Anesthesia Complications ?Negative for: history of anesthetic complications ? ?Airway ?Mallampati: IV ? ? ?Neck ROM: Full ? ? ? Dental ? ? ?Missing lower left tooth; right molar chipped:   ?Pulmonary ?neg pulmonary ROS,  ?  ?Pulmonary exam normal ?breath sounds clear to auscultation ? ? ? ? ? ? Cardiovascular ?Exercise Tolerance: Good ?negative cardio ROS ?Normal cardiovascular exam ?Rhythm:Regular Rate:Normal ? ?ECG 10/23/21:  SR with no acute ischemic changes ?  ?Neuro/Psych ?negative neurological ROS ?   ? GI/Hepatic ?negative GI ROS,   ?Endo/Other  ?negative endocrine ROS ? Renal/GU ?negative Renal ROS  ? ?  ?Musculoskeletal ? ? Abdominal ?  ?Peds ? Hematology ?negative hematology ROS ?(+)   ?Anesthesia Other Findings ? ? Reproductive/Obstetrics ? ?  ? ? ? ? ? ? ? ? ? ? ? ? ? ?  ?  ? ? ? ? ? ? ? ?Anesthesia Physical ?Anesthesia Plan ? ?ASA: 1 ? ?Anesthesia Plan: General  ? ?Post-op Pain Management:   ? ?Induction: Intravenous ? ?PONV Risk Score and Plan: 2 and Propofol infusion, TIVA and Treatment may vary due to age or medical condition ? ?Airway Management Planned: Natural Airway ? ?Additional Equipment:  ? ?Intra-op Plan:  ? ?Post-operative Plan:  ? ?Informed Consent: I have reviewed the patients History and Physical, chart, labs and discussed the procedure including the risks, benefits and alternatives for the proposed anesthesia with the patient or authorized representative who has indicated his/her understanding and acceptance.  ? ? ? ? ? ?Plan Discussed with: CRNA ? ?Anesthesia Plan Comments: (LMA/GETA backup discussed.  Patient consented for risks of anesthesia including but not limited to:  ?- adverse reactions to medications ?- damage to eyes, teeth, lips or other  oral mucosa ?- nerve damage due to positioning  ?- sore throat or hoarseness ?- damage to heart, brain, nerves, lungs, other parts of body or loss of life ? ?Informed patient about role of CRNA in peri- and intra-operative care.  Patient voiced understanding.)  ? ? ? ? ? ? ?Anesthesia Quick Evaluation ? ?

## 2022-01-22 NOTE — H&P (Signed)
? ? ? ?Alex Bellows, MD ?[address], [place], [place], [place], [address] ?[address] [place], [place], [place], [address] ?Phone: [phone]  ?Fax: [phone] ? ?Primary Care Physician:  Einar Pheasant, MD ? ? ?Pre-Procedure History & Physical: ?HPI:  Johnston Maddocks is a 51 y.o. male is here for an colonoscopy. ?  ?Past Medical History:  ?Diagnosis Date  ? Hypercholesterolemia   ? ? ?Past Surgical History:  ?Procedure Laterality Date  ? HERNIA REPAIR    ? VASECTOMY    ? ? ?Prior to Admission medications   ?Medication Sig Start Date End Date Taking? Authorizing Provider  ?finasteride (PROPECIA) 1 MG tablet Take 1 tablet (1 mg total) by mouth daily. 09/07/21  Yes Einar Pheasant, MD  ?rosuvastatin (CRESTOR) 10 MG tablet Take 1 tablet (10 mg total) by mouth daily. 10/24/21  Yes Einar Pheasant, MD  ? ? ?Allergies as of 12/05/2021  ? (No Known Allergies)  ? ? ?Family History  ?Problem Relation Age of Onset  ? Varicose Veins Mother   ? Miscarriages / [place] Mother   ? Thyroid disease Mother   ? Heart disease Father   ? Stroke Father   ? Thyroid disease Sister   ? Thyroid disease Sister   ? Cancer Sister   ?     ovarian cancer  ? Heart attack Maternal Grandmother   ? Thyroid disease Maternal Grandmother   ? Cancer Maternal Grandfather   ?     liver cancer  ? Heart attack Paternal Grandmother   ? ? ?Social History  ? ?Socioeconomic History  ? Marital status: Married  ?  Spouse name: Not on file  ? Number of children: 3  ? Years of education: Not on file  ? Highest education level: Not on file  ?Occupational History  ? Not on file  ?Tobacco Use  ? Smoking status: Never  ? Smokeless tobacco: Never  ?Substance and Sexual Activity  ? Alcohol use: Yes  ?  Comment: occasional  ? Drug use: No  ? Sexual activity: Yes  ?Other Topics Concern  ? Not on file  ?Social History Narrative  ? Not on file  ? ?Social Determinants of Health  ? ?Financial Resource Strain: Not on file  ?Food Insecurity: Not on file  ?Transportation  Needs: Not on file  ?Physical Activity: Not on file  ?Stress: Not on file  ?Social Connections: Not on file  ?Intimate Partner Violence: Not on file  ? ? ?Review of Systems: ?See HPI, otherwise negative ROS ? ?Physical Exam: ?BP (!) 129/91   Pulse 96   Temp (!) 97.2 ?F (36.2 ?C) (Temporal)   Resp 16   Ht '5\' 10"'$  (1.778 m)   Wt 93 kg   SpO2 97%   BMI 29.41 kg/m?  ?General:   Alert,  pleasant and cooperative in NAD ?Head:  Normocephalic and atraumatic. ?Neck:  Supple; no masses or thyromegaly. ?Lungs:  Clear throughout to auscultation, normal respiratory effort.    ?Heart:  +S1, +S2, Regular rate and rhythm, No edema. ?Abdomen:  Soft, nontender and nondistended. Normal bowel sounds, without guarding, and without rebound.   ?Neurologic:  Alert and  oriented x4;  grossly normal neurologically. ? ?Impression/Plan: ?Aqib Lough is here for an colonoscopy to be performed for Screening colonoscopy average risk   ?Risks, benefits, limitations, and alternatives regarding  colonoscopy have been reviewed with the patient.  Questions have been answered.  All parties agreeable. ? ? ?Alex Bellows, MD  01/22/2022, 7:48 AM ? ?

## 2022-01-22 NOTE — Anesthesia Postprocedure Evaluation (Signed)
Anesthesia Post Note ? ?Patient: Alex Peterson ? ?Procedure(s) Performed: COLONOSCOPY WITH PROPOFOL ? ?Patient location during evaluation: PACU ?Anesthesia Type: General ?Level of consciousness: awake and alert, oriented and patient cooperative ?Pain management: pain level controlled ?Vital Signs Assessment: post-procedure vital signs reviewed and stable ?Respiratory status: spontaneous breathing, nonlabored ventilation and respiratory function stable ?Cardiovascular status: blood pressure returned to baseline and stable ?Postop Assessment: adequate PO intake ?Anesthetic complications: no ? ? ?No notable events documented. ? ? ?Last Vitals:  ?Vitals:  ? 01/22/22 0840 01/22/22 0850  ?BP: 118/90 (!) 123/91  ?Pulse: 74 74  ?Resp: 11 16  ?Temp:    ?SpO2: 100% 100%  ?  ?Last Pain:  ?Vitals:  ? 01/22/22 0813  ?TempSrc: Temporal  ?PainSc:   ? ? ?  ?  ?  ?  ?  ?  ? ?Darrin Nipper ? ? ? ? ?

## 2022-01-22 NOTE — Transfer of Care (Signed)
Immediate Anesthesia Transfer of Care Note ? ?Patient: Alex Peterson ? ?Procedure(s) Performed: COLONOSCOPY WITH PROPOFOL ? ?Patient Location: Endoscopy Unit ? ?Anesthesia Type:General ? ?Level of Consciousness: drowsy ? ?Airway & Oxygen Therapy: Patient Spontanous Breathing ? ?Post-op Assessment: Report given to RN and Post -op Vital signs reviewed and stable ? ?Post vital signs: Reviewed and stable ? ?Last Vitals: see Epic flowsheet ?Vitals Value Taken Time  ?BP    ?Temp    ?Pulse    ?Resp    ?SpO2    ? ? ?Last Pain:  ?Vitals:  ? 01/22/22 0717  ?TempSrc: Temporal  ?PainSc: 0-No pain  ?   ? ?  ? ?Complications: No notable events documented. ?

## 2022-01-22 NOTE — Op Note (Signed)
[facility] ?Gastroenterology ?Patient Name: Alex Peterson ?Procedure Date: 01/22/2022 7:12 AM ?MRN: [id number] ?Account #: [id number] ?Date of Birth: January 24, 1971 ?Admit Type: Outpatient ?Age: 51 ?Room: [facility] ENDO ROOM 3 ?Gender: Male ?Note Status: Finalized ?Instrument Name: Colonscope [id number] ?Procedure:             Colonoscopy ?Indications:           Screening for colorectal malignant neoplasm ?Providers:             Jonathon Bellows MD, MD ?Referring MD:          Einar Pheasant, MD (Referring MD) ?Medicines:             Monitored Anesthesia Care ?Complications:         No immediate complications. ?Procedure:             Pre-Anesthesia Assessment: ?                       - Prior to the procedure, a History and Physical was  ?                       performed, and patient medications, allergies and  ?                       sensitivities were reviewed. The patient's tolerance  ?                       of previous anesthesia was reviewed. ?                       - The risks and benefits of the procedure and the  ?                       sedation options and risks were discussed with the  ?                       patient. All questions were answered and informed  ?                       consent was obtained. ?                       - ASA Grade Assessment: II - A patient with mild  ?                       systemic disease. ?                       After obtaining informed consent, the colonoscope was  ?                       passed under direct vision. Throughout the procedure,  ?                       the patient's blood pressure, pulse, and oxygen  ?                       saturations were monitored continuously. The  ?                       Colonoscope was introduced  through the anus and  ?                       advanced to the the cecum, identified by the  ?                       appendiceal orifice. The colonoscopy was performed  ?                       with ease. The patient tolerated the procedure well.  ?                        The quality of the bowel preparation was excellent. ?Findings: ?     The perianal and digital rectal examinations were normal. ?     Three sessile polyps were found in the ascending colon. The polyps were  ?     4 to 7 mm in size. These polyps were removed with a cold snare.  ?     Resection and retrieval were complete. ?     The exam was otherwise without abnormality on direct and retroflexion  ?     views. ?Impression:            - Three 4 to 7 mm polyps in the ascending colon,  ?                       removed with a cold snare. Resected and retrieved. ?                       - The examination was otherwise normal on direct and  ?                       retroflexion views. ?Recommendation:        - Discharge patient to home (with escort). ?                       - Resume previous diet. ?                       - Continue present medications. ?                       - Await pathology results. ?                       - Repeat colonoscopy in 3 years for surveillance based  ?                       on pathology results. ?Procedure Code(s):     --- Professional --- ?                       [phone], Colonoscopy, flexible; with removal of  ?                       tumor(s), polyp(s), or other lesion(s) by snare  ?                       technique ?Diagnosis Code(s):     --- Professional --- ?  Z12.11, Encounter for screening for malignant neoplasm  ?                       of colon ?                       K63.5, Polyp of colon ?CPT copyright 2019 American Medical Association. All rights reserved. ?The codes documented in this report are preliminary and upon coder review may  ?be revised to meet current compliance requirements. ?Jonathon Bellows, MD ?Jonathon Bellows MD, MD ?01/22/2022 8:10:55 AM ?This report has been signed electronically. ?Number of Addenda: 0 ?Note Initiated On: 01/22/2022 7:12 AM ?Scope Withdrawal Time: 0 hours 12 minutes 48 seconds  ?Total Procedure Duration: 0 hours 15 minutes 6  seconds  ?Estimated Blood Loss:  Estimated blood loss: none. ?     [facility] ?

## 2022-01-23 ENCOUNTER — Encounter: Payer: Self-pay | Admitting: Internal Medicine

## 2022-01-23 ENCOUNTER — Ambulatory Visit (INDEPENDENT_AMBULATORY_CARE_PROVIDER_SITE_OTHER): Payer: BC Managed Care – PPO | Admitting: Internal Medicine

## 2022-01-23 ENCOUNTER — Other Ambulatory Visit: Payer: Self-pay

## 2022-01-23 VITALS — BP 126/80 | HR 77 | Temp 97.9°F | Ht 71.0 in | Wt 215.0 lb

## 2022-01-23 DIAGNOSIS — Z1211 Encounter for screening for malignant neoplasm of colon: Secondary | ICD-10-CM

## 2022-01-23 DIAGNOSIS — E7800 Pure hypercholesterolemia, unspecified: Secondary | ICD-10-CM

## 2022-01-23 DIAGNOSIS — R918 Other nonspecific abnormal finding of lung field: Secondary | ICD-10-CM

## 2022-01-23 LAB — SURGICAL PATHOLOGY

## 2022-01-23 NOTE — Progress Notes (Signed)
Patient ID: Alex Peterson, male   DOB: 10-Nov-1970, 51 y.o.   MRN: [id number] ? ? ?Subjective:  ? ? Patient ID: Alex Peterson, male    DOB: Jun 19, 1971, 51 y.o.   MRN: [id number] ? ?This visit occurred during the SARS-CoV-2 public health emergency.  Safety protocols were in place, including screening questions prior to the visit, additional usage of staff PPE, and extensive cleaning of exam room while observing appropriate contact time as indicated for disinfecting solutions.  ? ?Patient here for a scheduled follow up.  ? ?Chief Complaint  ?Patient presents with  ? Follow-up  ?  3 mo f/u  ? .  ? ?HPI ?Here to follow up regarding cholesterol. He is doing well.  Has adjusted diet.  Lost weight.  Walking.  No soft drinks.  No chest pain or sob reported.  No abdominal pain or bowel change reported. Colonoscopy yesterday.  Three polyps.  Was told f/u three years.   ? ? ?Past Medical History:  ?Diagnosis Date  ? Hypercholesterolemia   ? ?Past Surgical History:  ?Procedure Laterality Date  ? COLONOSCOPY WITH PROPOFOL N/A 01/22/2022  ? Procedure: COLONOSCOPY WITH PROPOFOL;  Surgeon: Jonathon Bellows, MD;  Location: [facility] ENDOSCOPY;  Service: Gastroenterology;  Laterality: N/A;  ? HERNIA REPAIR    ? VASECTOMY    ? ?Family History  ?Problem Relation Age of Onset  ? Varicose Veins Mother   ? Miscarriages / [place] Mother   ? Thyroid disease Mother   ? Heart disease Father   ? Stroke Father   ? Thyroid disease Sister   ? Thyroid disease Sister   ? Cancer Sister   ?     ovarian cancer  ? Heart attack Maternal Grandmother   ? Thyroid disease Maternal Grandmother   ? Cancer Maternal Grandfather   ?     liver cancer  ? Heart attack Paternal Grandmother   ? ?Social History  ? ?Socioeconomic History  ? Marital status: Married  ?  Spouse name: Not on file  ? Number of children: 3  ? Years of education: Not on file  ? Highest education level: Not on file  ?Occupational History  ? Not on file  ?Tobacco Use  ? Smoking status: Never  ? Smokeless tobacco:  Never  ?Substance and Sexual Activity  ? Alcohol use: Yes  ?  Comment: occasional  ? Drug use: No  ? Sexual activity: Yes  ?Other Topics Concern  ? Not on file  ?Social History Narrative  ? Not on file  ? ?Social Determinants of Health  ? ?Financial Resource Strain: Not on file  ?Food Insecurity: Not on file  ?Transportation Needs: Not on file  ?Physical Activity: Not on file  ?Stress: Not on file  ?Social Connections: Not on file  ? ? ? ?Review of Systems  ?Constitutional:  Negative for appetite change and unexpected weight change.  ?HENT:  Negative for congestion and sinus pressure.   ?Respiratory:  Negative for cough, chest tightness and shortness of breath.   ?Cardiovascular:  Negative for chest pain, palpitations and leg swelling.  ?Gastrointestinal:  Negative for abdominal pain, diarrhea, nausea and vomiting.  ?Genitourinary:  Negative for difficulty urinating and dysuria.  ?Musculoskeletal:  Negative for joint swelling and myalgias.  ?Skin:  Negative for color change and rash.  ?Neurological:  Negative for dizziness, light-headedness and headaches.  ?Psychiatric/Behavioral:  Negative for agitation and dysphoric mood.   ? ?   ?Objective:  ?  ? ?BP 126/80 (BP Location: Left Arm,  Patient Position: Sitting, Cuff Size: Large)   Pulse 77   Temp 97.9 ?F (36.6 ?C) (Oral)   Ht '5\' 11"'$  (1.803 m)   Wt 215 lb (97.5 kg)   SpO2 98%   BMI 29.99 kg/m?  ?Wt Readings from Last 3 Encounters:  ?01/23/22 215 lb (97.5 kg)  ?01/22/22 205 lb (93 kg)  ?10/23/21 225 lb (102.1 kg)  ? ? ?Physical Exam ?Constitutional:   ?   General: He is not in acute distress. ?   Appearance: Normal appearance. He is well-developed.  ?HENT:  ?   Head: Normocephalic and atraumatic.  ?   Right Ear: External ear normal.  ?   Left Ear: External ear normal.  ?Eyes:  ?   General: No scleral icterus.    ?   Right eye: No discharge.     ?   Left eye: No discharge.  ?Cardiovascular:  ?   Rate and Rhythm: Normal rate and regular rhythm.  ?Pulmonary:  ?    Effort: Pulmonary effort is normal. No respiratory distress.  ?   Breath sounds: Normal breath sounds.  ?Abdominal:  ?   General: Bowel sounds are normal.  ?   Palpations: Abdomen is soft.  ?   Tenderness: There is no abdominal tenderness.  ?Musculoskeletal:     ?   General: No swelling or tenderness.  ?   Cervical back: Neck supple. No tenderness.  ?Lymphadenopathy:  ?   Cervical: No cervical adenopathy.  ?Skin: ?   Findings: No erythema or rash.  ?Neurological:  ?   Mental Status: He is alert.  ?Psychiatric:     ?   Mood and Affect: Mood normal.     ?   Behavior: Behavior normal.  ? ? ? ?Outpatient Encounter Medications as of 01/23/2022  ?Medication Sig  ? finasteride (PROPECIA) 1 MG tablet Take 1 tablet (1 mg total) by mouth daily.  ? rosuvastatin (CRESTOR) 10 MG tablet Take 1 tablet (10 mg total) by mouth daily.  ? ?No facility-administered encounter medications on file as of 01/23/2022.  ?  ? ?Lab Results  ?Component Value Date  ? GLUCOSE 105 (H) 10/23/2021  ? CHOL 279 (H) 10/23/2021  ? TRIG 146.0 10/23/2021  ? HDL 54.40 10/23/2021  ? LDLCALC 195 (H) 10/23/2021  ? ALT 46 12/05/2021  ? AST 27 12/05/2021  ? NA 139 12/05/2021  ? K 4.3 10/23/2021  ? CL 98 10/23/2021  ? CREATININE 0.92 10/23/2021  ? BUN 13 10/23/2021  ? CO2 29 10/23/2021  ? TSH 1.15 10/23/2021  ? PSA 0.70 10/23/2021  ? HGBA1C 5.5 12/05/2021  ? ? ?   ?Assessment & Plan:  ? ?Problem List Items Addressed This Visit   ? ? Colon cancer screening  ?  Colonoscopy yesterday.  Few polyps removed.  Waiting for pathology.  F/u colonoscopy 3 years.  ?  ?  ? Hypercholesterolemia - Primary  ?  On crestor.  Tolerating.  Has adjusted diet.  Lost weight.  Follow lipid panel and liver function tests.  ?  ?  ? Relevant Orders  ? Lipid Profile  ? CBC w/Diff  ? Lipid Profile  ? Hepatic function panel  ? Basic metabolic panel  ? Pulmonary nodules  ?  Cardiac scoring 11/2021.  Pulmonary nodules noted on CT.  Schedule f/u CT chest in one year.   ?  ?  ? ? ? ?Einar Pheasant,  MD  ?

## 2022-01-24 ENCOUNTER — Encounter: Payer: Self-pay | Admitting: Gastroenterology

## 2022-01-26 ENCOUNTER — Encounter: Payer: Self-pay | Admitting: Internal Medicine

## 2022-01-26 NOTE — Assessment & Plan Note (Signed)
Cardiac scoring 11/2021.  Pulmonary nodules noted on CT.  Schedule f/u CT chest in one year.   

## 2022-01-26 NOTE — Assessment & Plan Note (Signed)
On crestor.  Tolerating.  Has adjusted diet.  Lost weight.  Follow lipid panel and liver function tests.  ?

## 2022-01-26 NOTE — Assessment & Plan Note (Signed)
Colonoscopy yesterday.  Few polyps removed.  Waiting for pathology.  F/u colonoscopy 3 years.  ?

## 2022-02-07 ENCOUNTER — Other Ambulatory Visit: Payer: Self-pay | Admitting: Internal Medicine

## 2022-03-19 ENCOUNTER — Encounter: Payer: Self-pay | Admitting: Internal Medicine

## 2022-03-21 ENCOUNTER — Other Ambulatory Visit (INDEPENDENT_AMBULATORY_CARE_PROVIDER_SITE_OTHER): Payer: BC Managed Care – PPO

## 2022-03-21 DIAGNOSIS — E7800 Pure hypercholesterolemia, unspecified: Secondary | ICD-10-CM

## 2022-03-21 LAB — HEPATIC FUNCTION PANEL
ALT: 62 U/L — ABNORMAL HIGH (ref 0–53)
AST: 31 U/L (ref 0–37)
Albumin: 4.4 g/dL (ref 3.5–5.2)
Alkaline Phosphatase: 86 U/L (ref 39–117)
Bilirubin, Direct: 0.1 mg/dL (ref 0.0–0.3)
Total Bilirubin: 0.5 mg/dL (ref 0.2–1.2)
Total Protein: 7.1 g/dL (ref 6.0–8.3)

## 2022-03-21 LAB — CBC WITH DIFFERENTIAL/PLATELET
Basophils Absolute: 0 10*3/uL (ref 0.0–0.1)
Basophils Relative: 0.6 % (ref 0.0–3.0)
Eosinophils Absolute: 0.1 10*3/uL (ref 0.0–0.7)
Eosinophils Relative: 1.6 % (ref 0.0–5.0)
HCT: 42.1 % (ref 39.0–52.0)
Hemoglobin: 14.7 g/dL (ref 13.0–17.0)
Lymphocytes Relative: 29.2 % (ref 12.0–46.0)
Lymphs Abs: 1.7 10*3/uL (ref 0.7–4.0)
MCHC: 34.9 g/dL (ref 30.0–36.0)
MCV: 92.8 fl (ref 78.0–100.0)
Monocytes Absolute: 0.7 10*3/uL (ref 0.1–1.0)
Monocytes Relative: 11.8 % (ref 3.0–12.0)
Neutro Abs: 3.3 10*3/uL (ref 1.4–7.7)
Neutrophils Relative %: 56.8 % (ref 43.0–77.0)
Platelets: 225 10*3/uL (ref 150.0–400.0)
RBC: 4.54 Mil/uL (ref 4.22–5.81)
RDW: 12.7 % (ref 11.5–15.5)
WBC: 5.8 10*3/uL (ref 4.0–10.5)

## 2022-03-21 LAB — BASIC METABOLIC PANEL
BUN: 12 mg/dL (ref 6–23)
CO2: 26 mEq/L (ref 19–32)
Calcium: 9.3 mg/dL (ref 8.4–10.5)
Chloride: 102 mEq/L (ref 96–112)
Creatinine, Ser: 0.97 mg/dL (ref 0.40–1.50)
GFR: 90.75 mL/min (ref >60.00)
Glucose, Bld: 127 mg/dL — ABNORMAL HIGH (ref 70–99)
Potassium: 3.9 mEq/L (ref 3.5–5.1)
Sodium: 138 mEq/L (ref 135–145)

## 2022-03-21 LAB — LIPID PANEL
Cholesterol: 168 mg/dL (ref 0–200)
HDL: 43.1 mg/dL (ref >39.00)
LDL Cholesterol: 108 mg/dL — ABNORMAL HIGH (ref 0–99)
NonHDL: 125.19
Total CHOL/HDL Ratio: 4
Triglycerides: 84 mg/dL (ref 0.0–149.0)
VLDL: 16.8 mg/dL (ref 0.0–40.0)

## 2022-03-24 ENCOUNTER — Ambulatory Visit: Payer: BC Managed Care – PPO | Admitting: Internal Medicine

## 2022-03-24 ENCOUNTER — Encounter: Payer: Self-pay | Admitting: Internal Medicine

## 2022-03-24 VITALS — BP 136/86 | HR 105 | Temp 97.8°F | Ht 71.0 in | Wt 217.0 lb

## 2022-03-24 DIAGNOSIS — R030 Elevated blood-pressure reading, without diagnosis of hypertension: Secondary | ICD-10-CM | POA: Insufficient documentation

## 2022-03-24 DIAGNOSIS — R7401 Elevation of levels of liver transaminase levels: Secondary | ICD-10-CM | POA: Insufficient documentation

## 2022-03-24 DIAGNOSIS — M67441 Ganglion, right hand: Secondary | ICD-10-CM | POA: Insufficient documentation

## 2022-03-24 DIAGNOSIS — R7301 Impaired fasting glucose: Secondary | ICD-10-CM | POA: Insufficient documentation

## 2022-03-24 DIAGNOSIS — E7800 Pure hypercholesterolemia, unspecified: Secondary | ICD-10-CM

## 2022-03-24 NOTE — Assessment & Plan Note (Signed)
Referral to hand surgeon for evaluation

## 2022-03-24 NOTE — Assessment & Plan Note (Signed)
In setting of recent statin initiation and vigorous yardwork prior to testing.  Repeat ordered

## 2022-03-24 NOTE — Patient Instructions (Addendum)
I am making a referral to hand surgeon ,  Dr Kathryne Gin at [facility] in [place] for evaluation of cyst presumed ganglionic) of right wrist   Return for nonfasting labs in 10 days to recheck liver enzyme and a1c per Dr Nicki Reaper    The new goals for optimal blood pressure management are 120/70. To 130/80.  Your BP was elevated again today   Please check your blood pressure a few times at home and send me or Dr Nicki Reaper  the readings so I can determine if you need to start  medication

## 2022-03-24 NOTE — Assessment & Plan Note (Signed)
Improved on Crestor.  ALT needs repeating in ten days

## 2022-03-24 NOTE — Assessment & Plan Note (Signed)
Recheck a1c   Lab Results  Component Value Date   HGBA1C 5.5 12/05/2021

## 2022-03-24 NOTE — Progress Notes (Signed)
Subjective:  Patient ID: Alex Peterson, male    DOB: Jun 28, 1971  Age: 51 y.o. MRN: [id number]  CC: The primary encounter diagnosis was Elevated fasting glucose. Diagnoses of White coat syndrome without diagnosis of hypertension, Elevated ALT measurement, Ganglion cyst of tendon sheath of right hand, and Hypercholesterolemia were also pertinent to this visit.   HPI Alex Peterson presents for  Chief Complaint  Patient presents with   Acute Visit    Knot on back of hand above the wrist x 1 month   Alex Peterson is a 51 yr old  right handed male who presents with a persistent focal swelling  on the dorsum of his  right wrist that has been present for the past month .  No history of trauma.  Types on a keyboard  for 8 hours daily  2) elevated ALT: noted on last weeks labs.  Takes Crestor  3) elevated fasting glucose of 127  on last weeks labs.    4) HIgh BMI:  using  intermittent fasting    Outpatient Medications Prior to Visit  Medication Sig Dispense Refill   finasteride (PROPECIA) 1 MG tablet TAKE 1 TABLET BY MOUTH EVERY DAY 30 tablet 1   rosuvastatin (CRESTOR) 10 MG tablet Take 1 tablet (10 mg total) by mouth daily. 90 tablet 1   No facility-administered medications prior to visit.    Review of Systems;  Patient denies headache, fevers, malaise, unintentional weight loss, skin rash, eye pain, sinus congestion and sinus pain, sore throat, dysphagia,  hemoptysis , cough, dyspnea, wheezing, chest pain, palpitations, orthopnea, edema, abdominal pain, nausea, melena, diarrhea, constipation, flank pain, dysuria, hematuria, urinary  Frequency, nocturia, numbness, tingling, seizures,  Focal weakness, Loss of consciousness,  Tremor, insomnia, depression, anxiety, and suicidal ideation.      Objective:  BP 136/86 (BP Location: Left Arm, Patient Position: Sitting, Cuff Size: Large)   Pulse (!) 105   Temp 97.8 F (36.6 C) (Oral)   Ht '5\' 11"'$  (1.803 m)   Wt 217 lb (98.4 kg)   SpO2 98%   BMI 30.27  kg/m   BP Readings from Last 3 Encounters:  03/24/22 136/86  01/23/22 126/80  01/22/22 (!) 123/91    Wt Readings from Last 3 Encounters:  03/24/22 217 lb (98.4 kg)  01/23/22 215 lb (97.5 kg)  01/22/22 205 lb (93 kg)    General appearance: alert, cooperative and appears stated age Ears: normal TM's and external ear canals both ears Throat: lips, mucosa, and tongue normal; teeth and gums normal Neck: no adenopathy, no carotid bruit, supple, symmetrical, trachea midline and thyroid not enlarged, symmetric, no tenderness/mass/nodules Back: symmetric, no curvature. ROM normal. No CVA tenderness. Lungs: clear to auscultation bilaterally Heart: regular rate and rhythm, S1, S2 normal, no murmur, click, rub or gallop Abdomen: soft, non-tender; bowel sounds normal; no masses,  no organomegaly Pulses: 2+ and symmetric Skin: Skin color, texture, turgor normal. No rashes or lesions Lymph nodes: Cervical, supraclavicular, and axillary nodes normal Ext: cystic swelling on tendon of dorsum of right wrist.  No tenderness or bruising   Lab Results  Component Value Date   HGBA1C 5.5 12/05/2021    Lab Results  Component Value Date   CREATININE 0.97 03/21/2022   CREATININE 0.92 10/23/2021    Lab Results  Component Value Date   WBC 5.8 03/21/2022   HGB 14.7 03/21/2022   HCT 42.1 03/21/2022   PLT 225.0 03/21/2022   GLUCOSE 127 (H) 03/21/2022   CHOL 168 03/21/2022  TRIG 84.0 03/21/2022   HDL 43.10 03/21/2022   LDLCALC 108 (H) 03/21/2022   ALT 62 (H) 03/21/2022   AST 31 03/21/2022   NA 138 03/21/2022   K 3.9 03/21/2022   CL 102 03/21/2022   CREATININE 0.97 03/21/2022   BUN 12 03/21/2022   CO2 26 03/21/2022   TSH 1.15 10/23/2021   PSA 0.70 10/23/2021   HGBA1C 5.5 12/05/2021    No results found.  Assessment & Plan:   Problem List Items Addressed This Visit     White coat syndrome without diagnosis of hypertension    He has no prior history of hypertension. He will check  his blood pressure several times over the next 3-4 weeks and to submit readings for evaluation.        Hypercholesterolemia    Improved on Crestor.  ALT needs repeating in ten days       Ganglion cyst of tendon sheath of right hand    Referral to hand surgeon for evaluation        Relevant Orders   Ambulatory referral to Hand Surgery   Elevated fasting glucose - Primary    Recheck a1c   Lab Results  Component Value Date   HGBA1C 5.5 12/05/2021         Relevant Orders   Hemoglobin A1c   Elevated ALT measurement    In setting of recent statin initiation and vigorous yardwork prior to testing.  Repeat ordered        Relevant Orders   Hepatic function panel    Follow-up: No follow-ups on file.   Crecencio Mc, MD

## 2022-03-24 NOTE — Assessment & Plan Note (Signed)
He has no prior history of hypertension. He will check his blood pressure several times over the next 3-4 weeks and to submit readings for evaluation.

## 2022-04-08 ENCOUNTER — Other Ambulatory Visit: Payer: BC Managed Care – PPO

## 2022-04-09 ENCOUNTER — Other Ambulatory Visit (INDEPENDENT_AMBULATORY_CARE_PROVIDER_SITE_OTHER): Payer: BC Managed Care – PPO

## 2022-04-09 DIAGNOSIS — R7301 Impaired fasting glucose: Secondary | ICD-10-CM | POA: Diagnosis not present

## 2022-04-09 DIAGNOSIS — R7401 Elevation of levels of liver transaminase levels: Secondary | ICD-10-CM | POA: Diagnosis not present

## 2022-04-09 LAB — HEPATIC FUNCTION PANEL
ALT: 37 U/L (ref 0–53)
AST: 23 U/L (ref 0–37)
Albumin: 4.1 g/dL (ref 3.5–5.2)
Alkaline Phosphatase: 74 U/L (ref 39–117)
Bilirubin, Direct: 0.1 mg/dL (ref 0.0–0.3)
Total Bilirubin: 0.6 mg/dL (ref 0.2–1.2)
Total Protein: 6.2 g/dL (ref 6.0–8.3)

## 2022-04-09 LAB — HEMOGLOBIN A1C: Hgb A1c MFr Bld: 5.8 % (ref 4.6–6.5)

## 2022-04-16 ENCOUNTER — Other Ambulatory Visit: Payer: Self-pay | Admitting: Internal Medicine

## 2022-04-27 ENCOUNTER — Other Ambulatory Visit: Payer: Self-pay | Admitting: Internal Medicine

## 2022-05-27 ENCOUNTER — Ambulatory Visit: Payer: BC Managed Care – PPO | Admitting: Internal Medicine

## 2022-05-27 ENCOUNTER — Encounter: Payer: Self-pay | Admitting: Internal Medicine

## 2022-05-27 VITALS — BP 130/84 | HR 85 | Temp 98.2°F | Resp 17 | Ht 72.0 in | Wt 219.2 lb

## 2022-05-27 DIAGNOSIS — R7301 Impaired fasting glucose: Secondary | ICD-10-CM

## 2022-05-27 DIAGNOSIS — E871 Hypo-osmolality and hyponatremia: Secondary | ICD-10-CM | POA: Diagnosis not present

## 2022-05-27 DIAGNOSIS — M67441 Ganglion, right hand: Secondary | ICD-10-CM

## 2022-05-27 DIAGNOSIS — E7800 Pure hypercholesterolemia, unspecified: Secondary | ICD-10-CM

## 2022-05-27 DIAGNOSIS — Z1211 Encounter for screening for malignant neoplasm of colon: Secondary | ICD-10-CM

## 2022-05-27 DIAGNOSIS — R7401 Elevation of levels of liver transaminase levels: Secondary | ICD-10-CM

## 2022-05-27 DIAGNOSIS — R918 Other nonspecific abnormal finding of lung field: Secondary | ICD-10-CM

## 2022-05-27 NOTE — Progress Notes (Signed)
Patient ID: Jalani Rominger, male   DOB: 09-29-1971, 51 y.o.   MRN: [id number]   Subjective:    Patient ID: Juleen Starr, male    DOB: 1971/05/03, 51 y.o.   MRN: [id number]   Patient here for a scheduled follow up.   Chief Complaint  Patient presents with   Hyperlipidemia   Hypertension   .   HPI Reports he is doing relatively well.  Tries to stay active. Not exercising as much lately.  No chest pain or sob reported.  No abdominal pain or bowel change reported.  Discussed diet and exercise.     Past Medical History:  Diagnosis Date   Hypercholesterolemia    Past Surgical History:  Procedure Laterality Date   COLONOSCOPY WITH PROPOFOL N/A 01/22/2022   Procedure: COLONOSCOPY WITH PROPOFOL;  Surgeon: Jonathon Bellows, MD;  Location: [facility] ENDOSCOPY;  Service: Gastroenterology;  Laterality: N/A;   HERNIA REPAIR     VASECTOMY     Family History  Problem Relation Age of Onset   Varicose Veins Mother    Miscarriages / [place] Mother    Thyroid disease Mother    Heart disease Father    Stroke Father    Thyroid disease Sister    Thyroid disease Sister    Cancer Sister        ovarian cancer   Heart attack Maternal Grandmother    Thyroid disease Maternal Grandmother    Cancer Maternal Grandfather        liver cancer   Heart attack Paternal Grandmother    Social History   Socioeconomic History   Marital status: Married    Spouse name: Not on file   Number of children: 3   Years of education: Not on file   Highest education level: Not on file  Occupational History   Not on file  Tobacco Use   Smoking status: Never   Smokeless tobacco: Never  Substance and Sexual Activity   Alcohol use: Yes    Comment: occasional   Drug use: No   Sexual activity: Yes  Other Topics Concern   Not on file  Social History Narrative   Not on file   Social Determinants of Health   Financial Resource Strain: Not on file  Food Insecurity: Not on file  Transportation Needs: Not on file  Physical  Activity: Not on file  Stress: Not on file  Social Connections: Not on file     Review of Systems  Constitutional:  Negative for appetite change and unexpected weight change.  HENT:  Negative for congestion and sinus pressure.   Respiratory:  Negative for cough, chest tightness and shortness of breath.   Cardiovascular:  Negative for chest pain, palpitations and leg swelling.  Gastrointestinal:  Negative for abdominal pain, diarrhea, nausea and vomiting.  Genitourinary:  Negative for difficulty urinating and dysuria.  Musculoskeletal:  Negative for joint swelling and myalgias.  Skin:  Negative for color change and rash.  Neurological:  Negative for dizziness, light-headedness and headaches.  Psychiatric/Behavioral:  Negative for agitation and dysphoric mood.        Objective:     BP 130/84 (BP Location: Left Arm, Patient Position: Sitting, Cuff Size: Large)   Pulse 85   Temp 98.2 F (36.8 C) (Temporal)   Resp 17   Ht 6' (1.829 m)   Wt 219 lb 3.2 oz (99.4 kg)   SpO2 98%   BMI 29.73 kg/m  Wt Readings from Last 3 Encounters:  05/27/22 219 lb 3.2 oz (  99.4 kg)  03/24/22 217 lb (98.4 kg)  01/23/22 215 lb (97.5 kg)    Physical Exam Constitutional:      General: He is not in acute distress.    Appearance: Normal appearance. He is well-developed.  HENT:     Head: Normocephalic and atraumatic.     Right Ear: External ear normal.     Left Ear: External ear normal.  Eyes:     General: No scleral icterus.       Right eye: No discharge.        Left eye: No discharge.  Cardiovascular:     Rate and Rhythm: Normal rate and regular rhythm.  Pulmonary:     Effort: Pulmonary effort is normal. No respiratory distress.     Breath sounds: Normal breath sounds.  Abdominal:     General: Bowel sounds are normal.     Palpations: Abdomen is soft.     Tenderness: There is no abdominal tenderness.  Musculoskeletal:        General: No swelling or tenderness.     Cervical back: Neck  supple. No tenderness.  Lymphadenopathy:     Cervical: No cervical adenopathy.  Skin:    Findings: No erythema or rash.  Neurological:     Mental Status: He is alert.  Psychiatric:        Mood and Affect: Mood normal.        Behavior: Behavior normal.      Outpatient Encounter Medications as of 05/27/2022  Medication Sig   finasteride (PROPECIA) 1 MG tablet TAKE 1 TABLET BY MOUTH EVERY DAY   rosuvastatin (CRESTOR) 10 MG tablet TAKE 1 TABLET BY MOUTH EVERY DAY   No facility-administered encounter medications on file as of 05/27/2022.     Lab Results  Component Value Date   WBC 5.8 03/21/2022   HGB 14.7 03/21/2022   HCT 42.1 03/21/2022   PLT 225.0 03/21/2022   GLUCOSE 127 (H) 03/21/2022   CHOL 168 03/21/2022   TRIG 84.0 03/21/2022   HDL 43.10 03/21/2022   LDLCALC 108 (H) 03/21/2022   ALT 37 04/09/2022   AST 23 04/09/2022   NA 138 03/21/2022   K 3.9 03/21/2022   CL 102 03/21/2022   CREATININE 0.97 03/21/2022   BUN 12 03/21/2022   CO2 26 03/21/2022   TSH 1.15 10/23/2021   PSA 0.70 10/23/2021   HGBA1C 5.8 04/09/2022       Assessment & Plan:   Problem List Items Addressed This Visit     Colon cancer screening    Colonoscopy 01/22/22 -  Few polyps removed.  Tubular adenomas.  F/u colonoscopy 3 years.       Elevated ALT measurement    Recheck wnl.  Follow.       Relevant Orders   Hepatic function panel   Elevated fasting glucose - Primary   Relevant Orders   HgB A1c   Ganglion cyst of tendon sheath of right hand    Evaluated by ortho.  S/p aspiration.  Still present.  No pain.  Follow.       Hypercholesterolemia    Improved on Crestor.  Low cholesterol diet and exercise.  Follow lipid panel and liver function tests.       Relevant Orders   Lipid Profile   Pulmonary nodules    Cardiac scoring 11/2021.  Pulmonary nodules noted on CT.  Schedule f/u CT chest in one year.        Other Visit Diagnoses  Hyponatremia       Relevant Orders   Basic  metabolic panel        Einar Pheasant, MD

## 2022-06-02 NOTE — Assessment & Plan Note (Signed)
Colonoscopy 01/22/22 -  Few polyps removed.  Tubular adenomas.  F/u colonoscopy 3 years.  

## 2022-06-02 NOTE — Assessment & Plan Note (Signed)
Cardiac scoring 11/2021.  Pulmonary nodules noted on CT.  Schedule f/u CT chest in one year.

## 2022-06-02 NOTE — Assessment & Plan Note (Signed)
Evaluated by ortho.  S/p aspiration.  Still present.  No pain.  Follow.

## 2022-06-02 NOTE — Assessment & Plan Note (Signed)
Improved on Crestor.  Low cholesterol diet and exercise.  Follow lipid panel and liver function tests.

## 2022-06-02 NOTE — Assessment & Plan Note (Signed)
Recheck wnl.  Follow.

## 2022-07-17 ENCOUNTER — Other Ambulatory Visit: Payer: Self-pay | Admitting: Internal Medicine

## 2022-07-22 ENCOUNTER — Other Ambulatory Visit (INDEPENDENT_AMBULATORY_CARE_PROVIDER_SITE_OTHER): Payer: BC Managed Care – PPO

## 2022-07-22 DIAGNOSIS — E871 Hypo-osmolality and hyponatremia: Secondary | ICD-10-CM | POA: Diagnosis not present

## 2022-07-22 DIAGNOSIS — R7301 Impaired fasting glucose: Secondary | ICD-10-CM | POA: Diagnosis not present

## 2022-07-22 DIAGNOSIS — E7800 Pure hypercholesterolemia, unspecified: Secondary | ICD-10-CM | POA: Diagnosis not present

## 2022-07-22 DIAGNOSIS — R7401 Elevation of levels of liver transaminase levels: Secondary | ICD-10-CM | POA: Diagnosis not present

## 2022-07-22 LAB — BASIC METABOLIC PANEL
BUN: 14 mg/dL (ref 6–23)
CO2: 27 mEq/L (ref 19–32)
Calcium: 9.3 mg/dL (ref 8.4–10.5)
Chloride: 102 mEq/L (ref 96–112)
Creatinine, Ser: 0.89 mg/dL (ref 0.40–1.50)
GFR: 99.39 mL/min (ref >60.00)
Glucose, Bld: 100 mg/dL — ABNORMAL HIGH (ref 70–99)
Potassium: 4.5 mEq/L (ref 3.5–5.1)
Sodium: 137 mEq/L (ref 135–145)

## 2022-07-22 LAB — LIPID PANEL
Cholesterol: 203 mg/dL — ABNORMAL HIGH (ref 0–200)
HDL: 56.7 mg/dL (ref >39.00)
LDL Cholesterol: 119 mg/dL — ABNORMAL HIGH (ref 0–99)
NonHDL: 146.39
Total CHOL/HDL Ratio: 4
Triglycerides: 137 mg/dL (ref 0.0–149.0)
VLDL: 27.4 mg/dL (ref 0.0–40.0)

## 2022-07-22 LAB — HEPATIC FUNCTION PANEL
ALT: 39 U/L (ref 0–53)
AST: 22 U/L (ref 0–37)
Albumin: 4.2 g/dL (ref 3.5–5.2)
Alkaline Phosphatase: 79 U/L (ref 39–117)
Bilirubin, Direct: 0.1 mg/dL (ref 0.0–0.3)
Total Bilirubin: 0.5 mg/dL (ref 0.2–1.2)
Total Protein: 6.6 g/dL (ref 6.0–8.3)

## 2022-07-22 LAB — HEMOGLOBIN A1C: Hgb A1c MFr Bld: 5.8 % (ref 4.6–6.5)

## 2022-09-29 ENCOUNTER — Ambulatory Visit: Payer: BC Managed Care – PPO | Admitting: Internal Medicine

## 2022-11-16 ENCOUNTER — Other Ambulatory Visit: Payer: Self-pay | Admitting: Family

## 2022-11-28 ENCOUNTER — Encounter: Payer: Self-pay | Admitting: Internal Medicine

## 2022-11-28 ENCOUNTER — Ambulatory Visit: Payer: BC Managed Care – PPO | Admitting: Internal Medicine

## 2022-11-28 VITALS — BP 128/84 | HR 94 | Temp 98.0°F | Resp 17 | Ht 71.0 in | Wt 228.0 lb

## 2022-11-28 DIAGNOSIS — E7800 Pure hypercholesterolemia, unspecified: Secondary | ICD-10-CM | POA: Diagnosis not present

## 2022-11-28 DIAGNOSIS — R918 Other nonspecific abnormal finding of lung field: Secondary | ICD-10-CM

## 2022-11-28 DIAGNOSIS — R030 Elevated blood-pressure reading, without diagnosis of hypertension: Secondary | ICD-10-CM | POA: Diagnosis not present

## 2022-11-28 DIAGNOSIS — Z1211 Encounter for screening for malignant neoplasm of colon: Secondary | ICD-10-CM | POA: Diagnosis not present

## 2022-11-28 DIAGNOSIS — Z125 Encounter for screening for malignant neoplasm of prostate: Secondary | ICD-10-CM

## 2022-11-28 DIAGNOSIS — H539 Unspecified visual disturbance: Secondary | ICD-10-CM

## 2022-11-28 DIAGNOSIS — R7301 Impaired fasting glucose: Secondary | ICD-10-CM

## 2022-11-28 MED ORDER — ROSUVASTATIN CALCIUM 20 MG PO TABS: 20.0000 mg | ORAL_TABLET | Freq: Every day | ORAL | 1 refills | Status: DC

## 2022-11-28 NOTE — Progress Notes (Unsigned)
Subjective:    Patient ID: Alex Peterson, male    DOB: 07/15/1971, 52 y.o.   MRN: [id number]  Patient here for  Chief Complaint  Patient presents with   Medical Management of Chronic Issues    HPI Here to follow up regarding his blood pressure and cholesterol.  Reports he has not been watching his diet as well.  He had not been exercising as much.  He is back playing soccer now.  Getting back into his routine.  No chest pain or shortness of breath.  No increased cough or congestion.  No abdominal pain or cramping.  No urine or bowel change.  He has noticed a gradual change in his vision.  No acute vision loss.  No headache lightheadedness or dizziness.  Left eye more than right.  No pain in his eye.  Request referral to ophthalmology for evaluation.   Past Medical History:  Diagnosis Date   Hypercholesterolemia    Past Surgical History:  Procedure Laterality Date   COLONOSCOPY WITH PROPOFOL N/A 01/22/2022   Procedure: COLONOSCOPY WITH PROPOFOL;  Surgeon: Jonathon Bellows, MD;  Location: [facility] ENDOSCOPY;  Service: Gastroenterology;  Laterality: N/A;   HERNIA REPAIR     VASECTOMY     Family History  Problem Relation Age of Onset   Varicose Veins Mother    Miscarriages / [place] Mother    Thyroid disease Mother    Heart disease Father    Stroke Father    Thyroid disease Sister    Thyroid disease Sister    Cancer Sister        ovarian cancer   Heart attack Maternal Grandmother    Thyroid disease Maternal Grandmother    Cancer Maternal Grandfather        liver cancer   Heart attack Paternal Grandmother    Social History   Socioeconomic History   Marital status: Married    Spouse name: Not on file   Number of children: 3   Years of education: Not on file   Highest education level: Not on file  Occupational History   Not on file  Tobacco Use   Smoking status: Never   Smokeless tobacco: Never  Substance and Sexual Activity   Alcohol use: Yes    Comment: occasional   Drug  use: No   Sexual activity: Yes  Other Topics Concern   Not on file  Social History Narrative   Not on file   Social Determinants of Health   Financial Resource Strain: Not on file  Food Insecurity: Not on file  Transportation Needs: Not on file  Physical Activity: Not on file  Stress: Not on file  Social Connections: Not on file     Review of Systems  Constitutional:  Negative for appetite change and unexpected weight change.  HENT:  Negative for congestion and sinus pressure.   Respiratory:  Negative for cough, chest tightness and shortness of breath.   Cardiovascular:  Negative for chest pain, palpitations and leg swelling.  Gastrointestinal:  Negative for abdominal pain, diarrhea, nausea and vomiting.  Genitourinary:  Negative for difficulty urinating and dysuria.  Musculoskeletal:  Negative for joint swelling and myalgias.  Skin:  Negative for color change and rash.  Neurological:  Negative for dizziness, light-headedness and headaches.  Psychiatric/Behavioral:  Negative for agitation and dysphoric mood.        Objective:     BP 128/84   Pulse 94   Temp 98 F (36.7 C)   Resp 17  Ht '5\' 11"'$  (1.803 m)   Wt 228 lb (103.4 kg)   SpO2 98%   BMI 31.80 kg/m  Wt Readings from Last 3 Encounters:  11/28/22 228 lb (103.4 kg)  05/27/22 219 lb 3.2 oz (99.4 kg)  03/24/22 217 lb (98.4 kg)    Physical Exam Constitutional:      General: He is not in acute distress.    Appearance: Normal appearance. He is well-developed.  HENT:     Head: Normocephalic and atraumatic.     Right Ear: External ear normal.     Left Ear: External ear normal.  Eyes:     General: No scleral icterus.       Right eye: No discharge.        Left eye: No discharge.  Cardiovascular:     Rate and Rhythm: Normal rate and regular rhythm.  Pulmonary:     Effort: Pulmonary effort is normal. No respiratory distress.     Breath sounds: Normal breath sounds.  Abdominal:     General: Bowel sounds are  normal.     Palpations: Abdomen is soft.     Tenderness: There is no abdominal tenderness.  Musculoskeletal:        General: No swelling or tenderness.     Cervical back: Neck supple. No tenderness.  Lymphadenopathy:     Cervical: No cervical adenopathy.  Skin:    Findings: No erythema or rash.  Neurological:     Mental Status: He is alert.  Psychiatric:        Mood and Affect: Mood normal.        Behavior: Behavior normal.      Outpatient Encounter Medications as of 11/28/2022  Medication Sig   rosuvastatin (CRESTOR) 20 MG tablet Take 1 tablet (20 mg total) by mouth daily.   finasteride (PROPECIA) 1 MG tablet TAKE 1 TABLET BY MOUTH EVERY DAY   [DISCONTINUED] rosuvastatin (CRESTOR) 10 MG tablet TAKE 1 TABLET BY MOUTH EVERY DAY   No facility-administered encounter medications on file as of 11/28/2022.     Lab Results  Component Value Date   WBC 5.8 03/21/2022   HGB 14.7 03/21/2022   HCT 42.1 03/21/2022   PLT 225.0 03/21/2022   GLUCOSE 100 (H) 07/22/2022   CHOL 203 (H) 07/22/2022   TRIG 137.0 07/22/2022   HDL 56.70 07/22/2022   LDLCALC 119 (H) 07/22/2022   ALT 39 07/22/2022   AST 22 07/22/2022   NA 137 07/22/2022   K 4.5 07/22/2022   CL 102 07/22/2022   CREATININE 0.89 07/22/2022   BUN 14 07/22/2022   CO2 27 07/22/2022   TSH 1.15 10/23/2021   PSA 0.70 10/23/2021   HGBA1C 5.8 07/22/2022    No results found.     Assessment & Plan:  Hypercholesterolemia Assessment & Plan: Was on crestor'10mg'$  q day.  Not taking now.  Given LDL 119 last visit and calcium score 6.69 - will restart crestor at '20mg'$  q day.  Follow lipid panel and liver function tests.    Orders: -     TSH; Future -     Hepatic function panel; Future -     Lipid panel; Future -     Basic metabolic panel; Future  Colon cancer screening Assessment & Plan: Colonoscopy 01/22/22 -  Few polyps removed.  Tubular adenomas.  F/u colonoscopy 3 years.    Pulmonary nodules Assessment & Plan: Cardiac  scoring 11/2021.  Pulmonary nodules noted on CT.  Recommended - f/u CT chest in  one year.  Discussed.  Agreeable.    Orders: -     CT CHEST WO CONTRAST; Future  Elevated blood pressure reading Assessment & Plan: Elevated initially.  Recheck improved.  Will spot check pressure and send in readings.  Follow pressure.  Follow metabolic panel.    Change in vision Assessment & Plan: Has noticed gradual vision change as outlined.  No acute vision loss.  No pain.  Refer to ophthalmology.     Prostate cancer screening -     PSA; Future  Elevated fasting glucose Assessment & Plan: Low carb diet and exercise.  Recheck met b and A1c.    Orders: -     Hemoglobin A1c; Future  Other orders -     Rosuvastatin Calcium; Take 1 tablet (20 mg total) by mouth daily.  Dispense: 90 tablet; Refill: 1     Einar Pheasant, MD

## 2022-11-29 ENCOUNTER — Telehealth: Payer: Self-pay | Admitting: Internal Medicine

## 2022-11-29 ENCOUNTER — Encounter: Payer: Self-pay | Admitting: Internal Medicine

## 2022-11-29 DIAGNOSIS — H539 Unspecified visual disturbance: Secondary | ICD-10-CM

## 2022-11-29 DIAGNOSIS — R030 Elevated blood-pressure reading, without diagnosis of hypertension: Secondary | ICD-10-CM | POA: Insufficient documentation

## 2022-11-29 NOTE — Assessment & Plan Note (Signed)
Was on crestor'10mg'$  q day.  Not taking now.  Given LDL 119 last visit and calcium score 6.69 - will restart crestor at '20mg'$  q day.  Follow lipid panel and liver function tests.

## 2022-11-29 NOTE — Assessment & Plan Note (Signed)
Cardiac scoring 11/2021.  Pulmonary nodules noted on CT.  Recommended - f/u CT chest in one year.  Discussed.  Agreeable.

## 2022-11-29 NOTE — Assessment & Plan Note (Signed)
Has noticed gradual vision change as outlined.  No acute vision loss.  No pain.  Refer to ophthalmology.

## 2022-11-29 NOTE — Telephone Encounter (Signed)
My chart message sent with opthlamology information.

## 2022-11-29 NOTE — Assessment & Plan Note (Signed)
Low carb diet and exercise.  Recheck met b and A1c.

## 2022-11-29 NOTE — Assessment & Plan Note (Signed)
Elevated initially.  Recheck improved.  Will spot check pressure and send in readings.  Follow pressure.  Follow metabolic panel.

## 2022-11-29 NOTE — Assessment & Plan Note (Signed)
Colonoscopy 01/22/22 -  Few polyps removed.  Tubular adenomas.  F/u colonoscopy 3 years.

## 2022-12-01 NOTE — Telephone Encounter (Signed)
Order placed for referral to ophthalmology.

## 2022-12-01 NOTE — Addendum Note (Signed)
Addended by: Alisa Graff on: 12/01/2022 08:10 PM   Modules accepted: Orders

## 2023-01-09 ENCOUNTER — Encounter: Payer: Self-pay | Admitting: Internal Medicine

## 2023-01-10 NOTE — Telephone Encounter (Signed)
Please call him and let him know that I received his message and just let me know when he wants to scheduled.  Thanks.

## 2023-01-12 ENCOUNTER — Ambulatory Visit: Payer: BC Managed Care – PPO

## 2023-01-23 ENCOUNTER — Other Ambulatory Visit (INDEPENDENT_AMBULATORY_CARE_PROVIDER_SITE_OTHER): Payer: BC Managed Care – PPO

## 2023-01-23 DIAGNOSIS — R7301 Impaired fasting glucose: Secondary | ICD-10-CM | POA: Diagnosis not present

## 2023-01-23 DIAGNOSIS — E7800 Pure hypercholesterolemia, unspecified: Secondary | ICD-10-CM

## 2023-01-23 DIAGNOSIS — Z125 Encounter for screening for malignant neoplasm of prostate: Secondary | ICD-10-CM

## 2023-01-23 LAB — BASIC METABOLIC PANEL
BUN: 17 mg/dL (ref 6–23)
CO2: 29 mEq/L (ref 19–32)
Calcium: 9.3 mg/dL (ref 8.4–10.5)
Chloride: 104 mEq/L (ref 96–112)
Creatinine, Ser: 0.89 mg/dL (ref 0.40–1.50)
GFR: 99.04 mL/min (ref >60.00)
Glucose, Bld: 109 mg/dL — ABNORMAL HIGH (ref 70–99)
Potassium: 4.3 mEq/L (ref 3.5–5.1)
Sodium: 141 mEq/L (ref 135–145)

## 2023-01-23 LAB — LIPID PANEL
Cholesterol: 177 mg/dL (ref 0–200)
HDL: 50.9 mg/dL (ref >39.00)
LDL Cholesterol: 100 mg/dL — ABNORMAL HIGH (ref 0–99)
NonHDL: 126.19
Total CHOL/HDL Ratio: 3
Triglycerides: 132 mg/dL (ref 0.0–149.0)
VLDL: 26.4 mg/dL (ref 0.0–40.0)

## 2023-01-23 LAB — HEPATIC FUNCTION PANEL
ALT: 43 U/L (ref 0–53)
AST: 24 U/L (ref 0–37)
Albumin: 4.4 g/dL (ref 3.5–5.2)
Alkaline Phosphatase: 85 U/L (ref 39–117)
Bilirubin, Direct: 0.1 mg/dL (ref 0.0–0.3)
Total Bilirubin: 0.4 mg/dL (ref 0.2–1.2)
Total Protein: 6.6 g/dL (ref 6.0–8.3)

## 2023-01-23 LAB — PSA: PSA: 0.3 ng/mL (ref 0.10–4.00)

## 2023-01-23 LAB — HEMOGLOBIN A1C: Hgb A1c MFr Bld: 5.8 % (ref 4.6–6.5)

## 2023-01-23 LAB — TSH: TSH: 1.46 u[IU]/mL (ref 0.35–5.50)

## 2023-03-02 ENCOUNTER — Encounter: Payer: BC Managed Care – PPO | Admitting: Internal Medicine

## 2023-03-04 ENCOUNTER — Other Ambulatory Visit: Payer: Self-pay | Admitting: Internal Medicine

## 2023-04-09 ENCOUNTER — Encounter: Payer: BC Managed Care – PPO | Admitting: Internal Medicine

## 2023-04-17 ENCOUNTER — Encounter: Payer: Self-pay | Admitting: Internal Medicine

## 2023-04-17 ENCOUNTER — Ambulatory Visit (INDEPENDENT_AMBULATORY_CARE_PROVIDER_SITE_OTHER): Payer: BC Managed Care – PPO | Admitting: Internal Medicine

## 2023-04-17 VITALS — BP 122/80 | HR 76 | Temp 97.9°F | Ht 71.0 in | Wt 221.6 lb

## 2023-04-17 DIAGNOSIS — Z0000 Encounter for general adult medical examination without abnormal findings: Secondary | ICD-10-CM

## 2023-04-17 DIAGNOSIS — R739 Hyperglycemia, unspecified: Secondary | ICD-10-CM | POA: Insufficient documentation

## 2023-04-17 DIAGNOSIS — E7800 Pure hypercholesterolemia, unspecified: Secondary | ICD-10-CM

## 2023-04-17 DIAGNOSIS — R030 Elevated blood-pressure reading, without diagnosis of hypertension: Secondary | ICD-10-CM

## 2023-04-17 DIAGNOSIS — R918 Other nonspecific abnormal finding of lung field: Secondary | ICD-10-CM

## 2023-04-17 DIAGNOSIS — Z1211 Encounter for screening for malignant neoplasm of colon: Secondary | ICD-10-CM | POA: Diagnosis not present

## 2023-04-17 NOTE — Assessment & Plan Note (Signed)
Physical today 04/17/23. Marland Kitchen  PSA 3/22/4 - .30.  Colonoscopy 3/22/3 - recommended f/u in 3 years.

## 2023-04-17 NOTE — Progress Notes (Unsigned)
Subjective:    Patient ID: Alex Peterson, male    DOB: [birth year], 52 y.o.   MRN: [id number]  Patient here for  Chief Complaint  Patient presents with   Annual Exam    HPI Here for physical exam.  He is doing well.  Playing soccer.  Walking.  No chest pain or sob reported.  No cough or congestion.  No abdominal pain or bowel change reported.  F/u CT scan chest?  Past Medical History:  Diagnosis Date   Hypercholesterolemia    Past Surgical History:  Procedure Laterality Date   COLONOSCOPY WITH PROPOFOL N/A 01/22/2022   Procedure: COLONOSCOPY WITH PROPOFOL;  Surgeon: Wyline Mood, MD;  Location: [facility] ENDOSCOPY;  Service: Gastroenterology;  Laterality: N/A;   HERNIA REPAIR     VASECTOMY     Family History  Problem Relation Age of Onset   Varicose Veins Mother    Miscarriages / [place] Mother    Thyroid disease Mother    Heart disease Father    Stroke Father    Thyroid disease Sister    Thyroid disease Sister    Cancer Sister        ovarian cancer   Heart attack Maternal Grandmother    Thyroid disease Maternal Grandmother    Cancer Maternal Grandfather        liver cancer   Heart attack Paternal Grandmother    Social History   Socioeconomic History   Marital status: Married    Spouse name: Not on file   Number of children: 3   Years of education: Not on file   Highest education level: Not on file  Occupational History   Not on file  Tobacco Use   Smoking status: Never   Smokeless tobacco: Never  Substance and Sexual Activity   Alcohol use: Yes    Comment: occasional   Drug use: No   Sexual activity: Yes  Other Topics Concern   Not on file  Social History Narrative   Not on file   Social Determinants of Health   Financial Resource Strain: Not on file  Food Insecurity: Not on file  Transportation Needs: Not on file  Physical Activity: Not on file  Stress: Not on file  Social Connections: Not on file     Review of Systems     Objective:      BP 136/86   Pulse 76   Temp 97.9 F (36.6 C) (Oral)   Ht 5\' 11"  (1.803 m)   Wt 221 lb 9.6 oz (100.5 kg)   SpO2 98%   BMI 30.91 kg/m  Wt Readings from Last 3 Encounters:  04/17/23 221 lb 9.6 oz (100.5 kg)  11/28/22 228 lb (103.4 kg)  05/27/22 219 lb 3.2 oz (99.4 kg)    Physical Exam   Outpatient Encounter Medications as of 04/17/2023  Medication Sig   finasteride (PROPECIA) 1 MG tablet TAKE 1 TABLET BY MOUTH EVERY DAY   rosuvastatin (CRESTOR) 20 MG tablet Take 1 tablet (20 mg total) by mouth daily.   No facility-administered encounter medications on file as of 04/17/2023.     Lab Results  Component Value Date   WBC 5.8 03/21/2022   HGB 14.7 03/21/2022   HCT 42.1 03/21/2022   PLT 225.0 03/21/2022   GLUCOSE 109 (H) 01/23/2023   CHOL 177 01/23/2023   TRIG 132.0 01/23/2023   HDL 50.90 01/23/2023   LDLCALC 100 (H) 01/23/2023   ALT 43 01/23/2023   AST 24 01/23/2023   NA  141 01/23/2023   K 4.3 01/23/2023   CL 104 01/23/2023   CREATININE 0.89 01/23/2023   BUN 17 01/23/2023   CO2 29 01/23/2023   TSH 1.46 01/23/2023   PSA 0.30 01/23/2023   HGBA1C 5.8 01/23/2023    No results found.     Assessment & Plan:  Healthcare maintenance Assessment & Plan: Physical today 04/17/23. Marland Kitchen  PSA 3/22/4 - .30.  Colonoscopy 3/22/3 - recommended f/u in 3 years.       Dale [place], MD

## 2023-04-19 ENCOUNTER — Encounter: Payer: Self-pay | Admitting: Internal Medicine

## 2023-04-19 NOTE — Assessment & Plan Note (Signed)
Cardiac scoring 11/2021.  Pulmonary nodules noted on CT.  Recommended - f/u CT chest in one year.  Discussed.  Agreeable.   

## 2023-04-19 NOTE — Assessment & Plan Note (Signed)
Colonoscopy 01/22/22 -  Few polyps removed.  Tubular adenomas.  F/u colonoscopy 3 years.  

## 2023-04-19 NOTE — Assessment & Plan Note (Signed)
Low-carb diet and exercise.  Follow met b and A1c. 

## 2023-04-19 NOTE — Assessment & Plan Note (Signed)
On crestor at 20mg  q day.  Follow lipid panel and liver function tests.

## 2023-04-19 NOTE — Assessment & Plan Note (Signed)
Elevated initially.  Recheck improved.  Will spot check pressure.  Follow pressure.  Follow metabolic panel.

## 2023-06-03 ENCOUNTER — Encounter: Payer: Self-pay | Admitting: Internal Medicine

## 2023-06-04 ENCOUNTER — Other Ambulatory Visit: Payer: Self-pay

## 2023-06-04 MED ORDER — ROSUVASTATIN CALCIUM 20 MG PO TABS: 20.0000 mg | ORAL_TABLET | Freq: Every day | ORAL | 1 refills | Status: DC

## 2023-06-17 ENCOUNTER — Other Ambulatory Visit (INDEPENDENT_AMBULATORY_CARE_PROVIDER_SITE_OTHER): Payer: BC Managed Care – PPO

## 2023-06-17 DIAGNOSIS — R739 Hyperglycemia, unspecified: Secondary | ICD-10-CM | POA: Diagnosis not present

## 2023-06-17 DIAGNOSIS — E7800 Pure hypercholesterolemia, unspecified: Secondary | ICD-10-CM

## 2023-06-17 LAB — CBC WITH DIFFERENTIAL/PLATELET
Basophils Absolute: 0 10*3/uL (ref 0.0–0.1)
Basophils Relative: 0.6 % (ref 0.0–3.0)
Eosinophils Absolute: 0.2 10*3/uL (ref 0.0–0.7)
Eosinophils Relative: 2.2 % (ref 0.0–5.0)
HCT: 45.2 % (ref 39.0–52.0)
Hemoglobin: 15 g/dL (ref 13.0–17.0)
Lymphocytes Relative: 24.7 % (ref 12.0–46.0)
Lymphs Abs: 1.8 10*3/uL (ref 0.7–4.0)
MCHC: 33.1 g/dL (ref 30.0–36.0)
MCV: 95.7 fl (ref 78.0–100.0)
Monocytes Absolute: 0.6 10*3/uL (ref 0.1–1.0)
Monocytes Relative: 8.3 % (ref 3.0–12.0)
Neutro Abs: 4.8 10*3/uL (ref 1.4–7.7)
Neutrophils Relative %: 64.2 % (ref 43.0–77.0)
Platelets: 277 10*3/uL (ref 150.0–400.0)
RBC: 4.72 Mil/uL (ref 4.22–5.81)
RDW: 12.7 % (ref 11.5–15.5)
WBC: 7.5 10*3/uL (ref 4.0–10.5)

## 2023-06-17 LAB — HEPATIC FUNCTION PANEL
ALT: 38 U/L (ref 0–53)
AST: 21 U/L (ref 0–37)
Albumin: 4.4 g/dL (ref 3.5–5.2)
Alkaline Phosphatase: 83 U/L (ref 39–117)
Bilirubin, Direct: 0.1 mg/dL (ref 0.0–0.3)
Total Bilirubin: 0.7 mg/dL (ref 0.2–1.2)
Total Protein: 7 g/dL (ref 6.0–8.3)

## 2023-06-17 LAB — BASIC METABOLIC PANEL
BUN: 17 mg/dL (ref 6–23)
CO2: 28 mEq/L (ref 19–32)
Calcium: 9.4 mg/dL (ref 8.4–10.5)
Chloride: 101 mEq/L (ref 96–112)
Creatinine, Ser: 0.95 mg/dL (ref 0.40–1.50)
GFR: 92.24 mL/min (ref >60.00)
Glucose, Bld: 114 mg/dL — ABNORMAL HIGH (ref 70–99)
Potassium: 4.3 mEq/L (ref 3.5–5.1)
Sodium: 136 mEq/L (ref 135–145)

## 2023-06-17 LAB — LIPID PANEL
Cholesterol: 177 mg/dL (ref 0–200)
HDL: 52.8 mg/dL (ref >39.00)
LDL Cholesterol: 101 mg/dL — ABNORMAL HIGH (ref 0–99)
NonHDL: 123.98
Total CHOL/HDL Ratio: 3
Triglycerides: 113 mg/dL (ref 0.0–149.0)
VLDL: 22.6 mg/dL (ref 0.0–40.0)

## 2023-06-17 LAB — HEMOGLOBIN A1C: Hgb A1c MFr Bld: 5.8 % (ref 4.6–6.5)

## 2023-06-23 ENCOUNTER — Telehealth: Payer: Self-pay

## 2023-06-23 NOTE — Telephone Encounter (Signed)
Lvm for pt to call back in regards to lab results.

## 2023-06-23 NOTE — Telephone Encounter (Signed)
Patient called back and I read message to him. He said he will leave a message on Mychart concerning the increase to 40 mg a day. He said could he maybe have a smaller mg like 20mg . His number is [phone].

## 2023-06-23 NOTE — Telephone Encounter (Signed)
-----   Message from [facility] sent at 06/18/2023  4:53 AM EDT ----- Notify - overall sugar control stable.  Cholesterol levels stable.  Confirm on crestor 10mg  q day and tolerating.  If so, then I would like to increase to 40mg  q day - for even better control.  Continue low carb diet and exercise.  Hgb, kidney function tests and liver function tests are wnl.

## 2023-06-23 NOTE — Telephone Encounter (Signed)
Please confirm dose he is taking.  Confirm actually taking 10mg  q day.  If so, can increase crestor to 20mg  q day.

## 2023-06-24 MED ORDER — ROSUVASTATIN CALCIUM 20 MG PO TABS: 20.0000 mg | ORAL_TABLET | Freq: Every day | ORAL | 1 refills | Status: DC

## 2023-06-24 NOTE — Addendum Note (Signed)
Addended by: Charm Barges on: 06/24/2023 11:29 PM   Modules accepted: Orders

## 2023-06-24 NOTE — Telephone Encounter (Signed)
Pt stated he is in fact taking the Crestor 10mg  every day. Informed pt to begin taking 20mg  every day and a new prescription would be sent to his local pharmacy.

## 2023-06-24 NOTE — Telephone Encounter (Signed)
Rx sent in for crestor 20mg  q day.

## 2023-07-31 ENCOUNTER — Telehealth: Payer: Self-pay | Admitting: Internal Medicine

## 2023-07-31 MED ORDER — ROSUVASTATIN CALCIUM 40 MG PO TABS
40.0000 mg | ORAL_TABLET | Freq: Every day | ORAL | 1 refills | Status: DC
Start: 2023-07-31 — End: 2024-01-29

## 2023-07-31 NOTE — Telephone Encounter (Signed)
I have sent in the rx for crestor 40mg  q day.  Please confirm he is doing ok on the medication and let him know rx has been sent in.

## 2023-07-31 NOTE — Telephone Encounter (Signed)
Okay to increase?

## 2023-07-31 NOTE — Telephone Encounter (Signed)
Patient just called and said he has a question about his medication rosuvastatin (CRESTOR) 20 MG tablet. He said he doubled up on the MG. He is now taking 40 mg. He needs another refill. The pharmacy he uses is [id number] - [place], [place] - [address], [place] [address] Phone: [phone]  Fax: [phone] DEA #: [id number]  His number is [phone]

## 2023-07-31 NOTE — Addendum Note (Signed)
Addended by: Charm Barges on: 07/31/2023 09:21 PM   Modules accepted: Orders

## 2023-08-01 ENCOUNTER — Encounter: Payer: Self-pay | Admitting: Internal Medicine

## 2023-08-03 NOTE — Telephone Encounter (Signed)
See mychart.  

## 2023-08-17 IMAGING — CT CT CARDIAC CORONARY ARTERY CALCIUM SCORE
3 series · 14 of 20 positions shown, 16 images · non-contrast
Comparison: None.
COMPARISON: None.

Addendum:
EXAM:
OVER-READ INTERPRETATION  CT CHEST

The following report is an over-read performed by radiologist Dr.
Jagruti Fanelli [REDACTED] on 11/15/2021. This
over-read does not include interpretation of cardiac or coronary
anatomy or pathology. The coronary calcium score interpretation by
the cardiologist is attached.
CLINICAL DATA: Risk stratification
Coronary Calcium Score
TECHNIQUE: The patient was scanned on a Siemens Somatom go.Top Scanner. Axial
non-contrast 3 mm slices were carried out through the heart. The
data set was analyzed on a dedicated work station and scored using
the Agatson method.

[Series 2: sa36 calcium scoring 3.00 · axial · 0.35mm/px · z∈[-1157,-1076]mm · 4 of 46 slices shown]
[im 10/46  vessel]
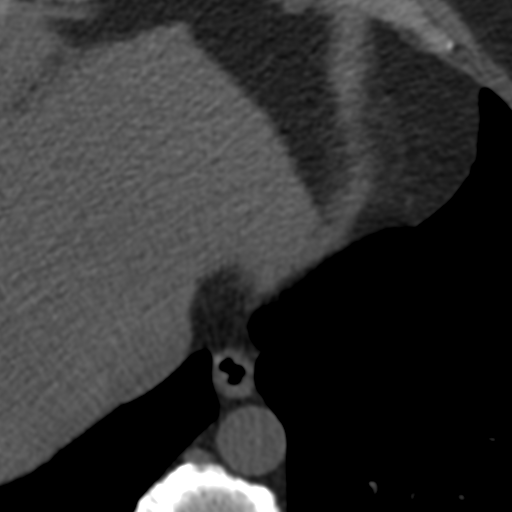
[im 19/46  vessel]
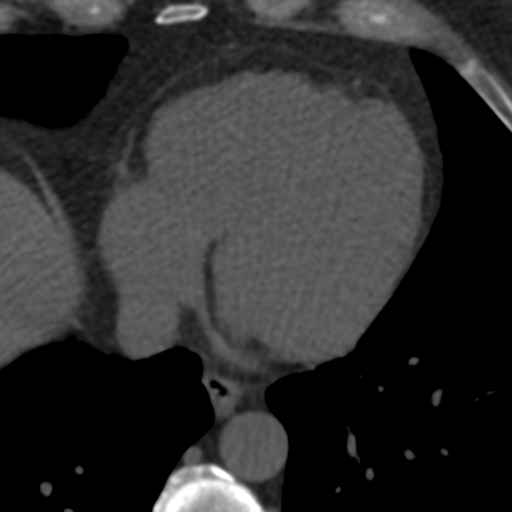
[im 28/46  vessel]
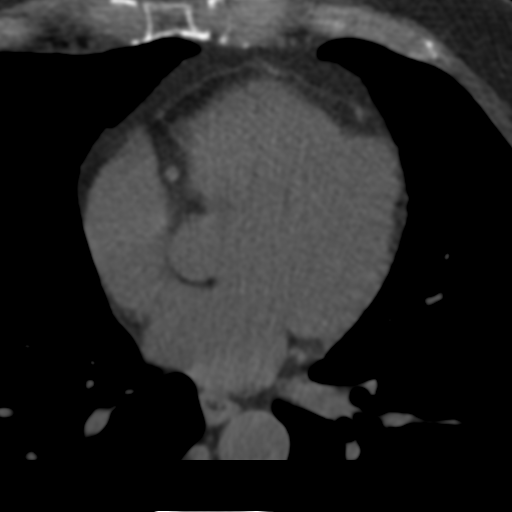
[im 37/46  vessel]
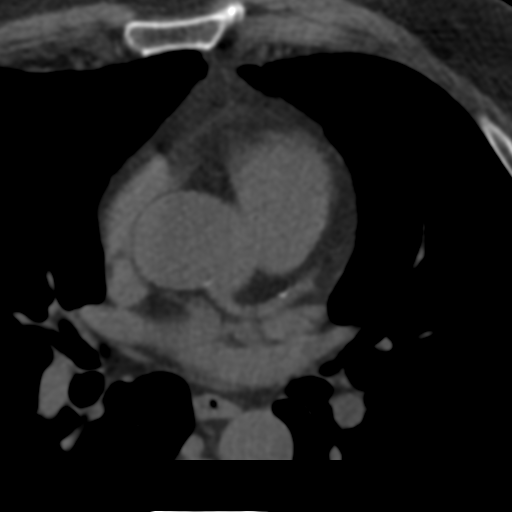

[Series 5: full fov st calcium scoring 3.00 · axial · 0.69mm/px · z∈[-1163,-1073]mm · 5 of 46 slices shown, 7 images]
[im 8/46  vessel]
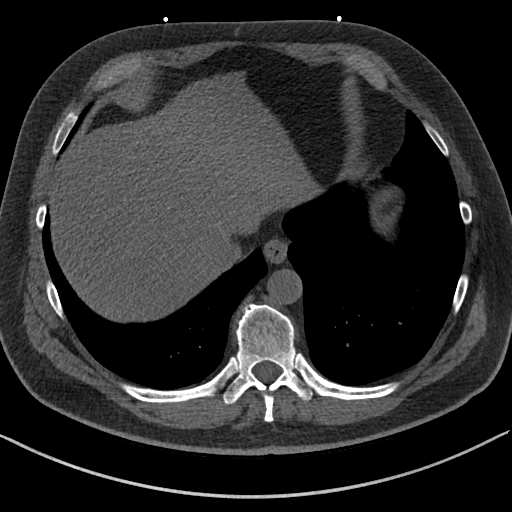
[im 8/46  lung]
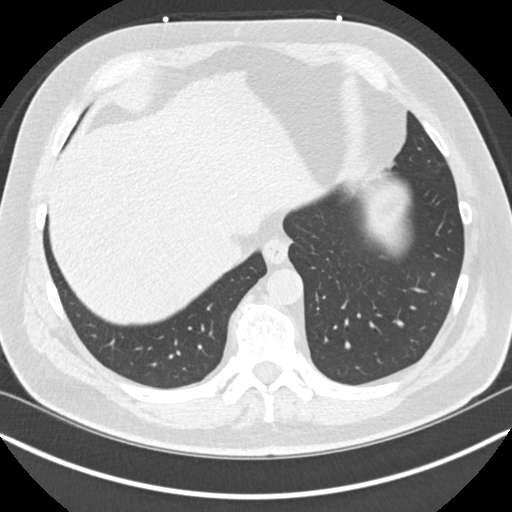
[im 16/46  vessel]
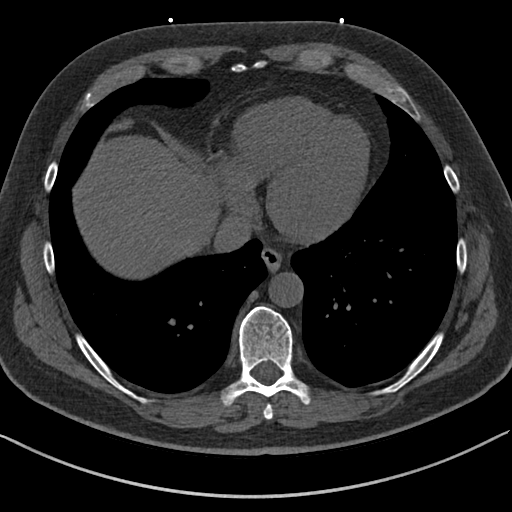
[im 23/46  vessel]
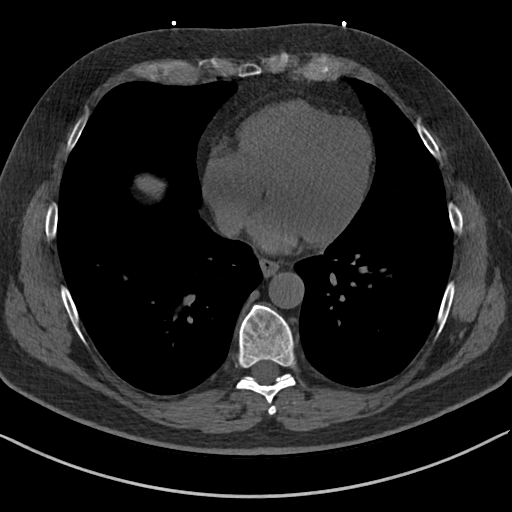
[im 31/46  vessel]
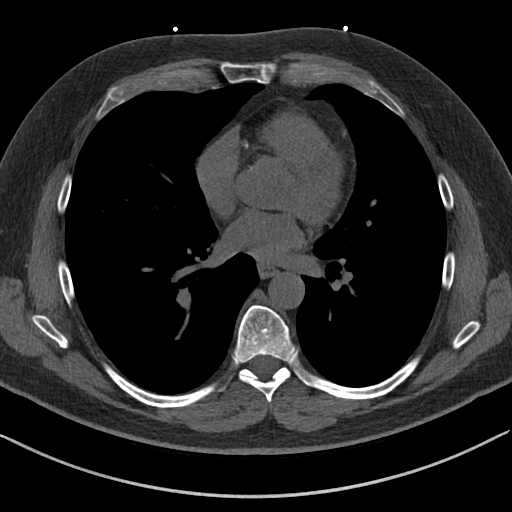
[im 38/46  vessel]
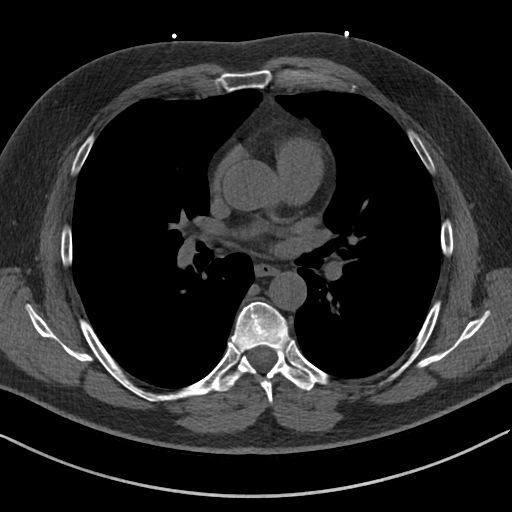
[im 38/46  lung]
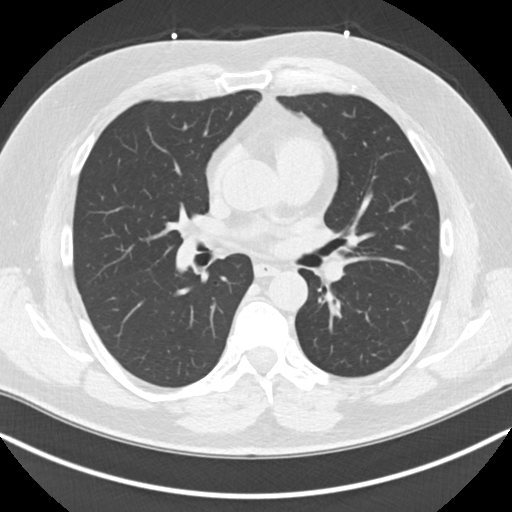

[Series 10: full fov lungs calcium scoring 3.00 ax · axial · 0.69mm/px · z∈[-1163,-1073]mm · 5 of 46 slices shown]
[im 8/46  vessel]
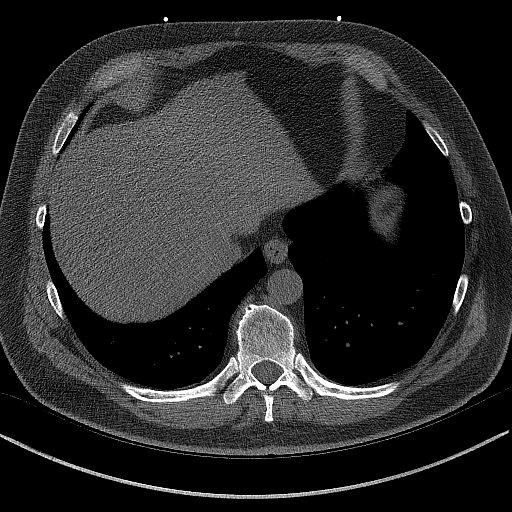
[im 16/46  vessel]
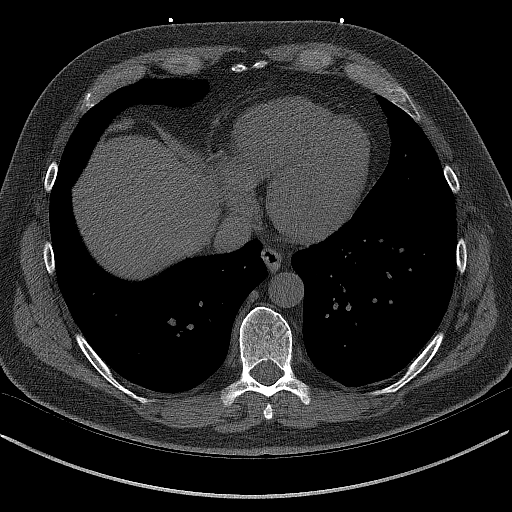
[im 23/46  vessel]
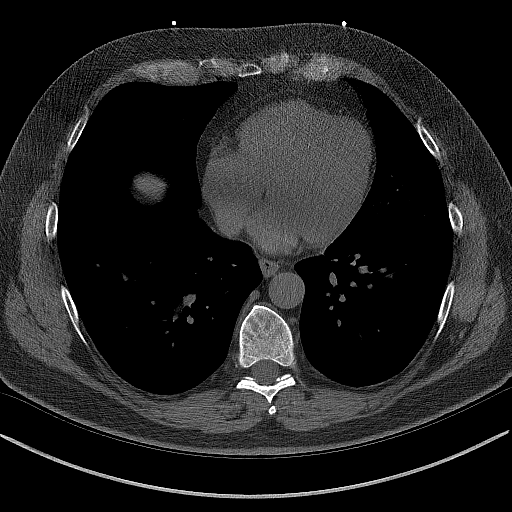
[im 31/46  vessel]
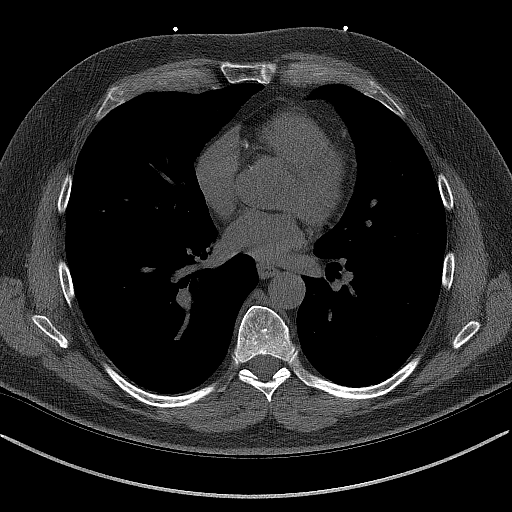
[im 38/46  vessel]
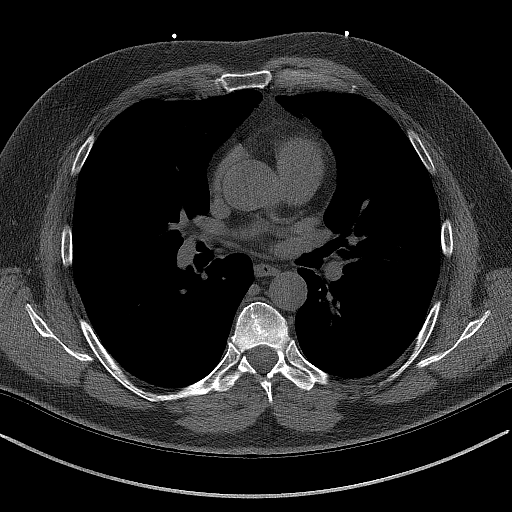

[14 of 20 positions shown; findings below may reference images not displayed]

FINDINGS: A few scattered tiny 2-5 mm pulmonary nodules are noted in the lungs
bilaterally, nonspecific, but statistically likely benign. Within
the visualized portions of the thorax there are no other larger more
suspicious appearing pulmonary nodules or masses, there is no acute
consolidative airspace disease, no pleural effusions, no
pneumothorax and no lymphadenopathy. Visualized portions of the
upper abdomen are unremarkable. There are no aggressive appearing
lytic or blastic lesions noted in the visualized portions of the
skeleton.
IMPRESSION: 1. Small pulmonary nodules measuring 2-5 mm in size, nonspecific,
but statistically likely benign. No follow-up needed if patient is
low-risk (and has no known or suspected primary neoplasm).
Non-contrast chest CT can be considered in 12 months if patient is
high-risk. This recommendation follows the consensus statement:
Guidelines for Management of Incidental Pulmonary Nodules Detected
FINDINGS: Non-cardiac: See separate report from [REDACTED].

Ascending Aorta: Normal size

Pericardium: Normal

Coronary arteries: Normal origin of left and right coronary
arteries. Distribution of arterial calcifications if present, as
noted below;

LM 0

LAD

LCx 0

RCA

Total

IMPRESSION AND RECOMMENDATION:
1. Coronary calcium score of 6.69. This was 66th percentile for age
and sex matched control.

2. CAC 1-99 in LAD and RCA. DENYS CORT1/N2.

3. Continue heart healthy lifestyle and risk factor modification.

Ingunn Harpa Ronlor

*** End of Addendum ***
EXAM:
OVER-READ INTERPRETATION  CT CHEST

The following report is an over-read performed by radiologist Dr.
Jagruti Fanelli [REDACTED] on 11/15/2021. This
over-read does not include interpretation of cardiac or coronary
anatomy or pathology. The coronary calcium score interpretation by
the cardiologist is attached.
FINDINGS: A few scattered tiny 2-5 mm pulmonary nodules are noted in the lungs
bilaterally, nonspecific, but statistically likely benign. Within
the visualized portions of the thorax there are no other larger more
suspicious appearing pulmonary nodules or masses, there is no acute
consolidative airspace disease, no pleural effusions, no
pneumothorax and no lymphadenopathy. Visualized portions of the
upper abdomen are unremarkable. There are no aggressive appearing
lytic or blastic lesions noted in the visualized portions of the
skeleton.
IMPRESSION: 1. Small pulmonary nodules measuring 2-5 mm in size, nonspecific,
but statistically likely benign. No follow-up needed if patient is
low-risk (and has no known or suspected primary neoplasm).
Non-contrast chest CT can be considered in 12 months if patient is
high-risk. This recommendation follows the consensus statement:
Guidelines for Management of Incidental Pulmonary Nodules Detected

## 2023-10-02 ENCOUNTER — Other Ambulatory Visit: Payer: Self-pay | Admitting: Internal Medicine

## 2023-10-19 ENCOUNTER — Ambulatory Visit (INDEPENDENT_AMBULATORY_CARE_PROVIDER_SITE_OTHER): Payer: BC Managed Care – PPO | Admitting: Internal Medicine

## 2023-10-19 ENCOUNTER — Encounter: Payer: Self-pay | Admitting: Internal Medicine

## 2023-10-19 VITALS — BP 118/72 | HR 96 | Temp 98.0°F | Resp 16 | Ht 71.0 in | Wt 229.4 lb

## 2023-10-19 DIAGNOSIS — E7800 Pure hypercholesterolemia, unspecified: Secondary | ICD-10-CM

## 2023-10-19 DIAGNOSIS — R739 Hyperglycemia, unspecified: Secondary | ICD-10-CM | POA: Diagnosis not present

## 2023-10-19 DIAGNOSIS — Z23 Encounter for immunization: Secondary | ICD-10-CM

## 2023-10-19 DIAGNOSIS — R918 Other nonspecific abnormal finding of lung field: Secondary | ICD-10-CM | POA: Diagnosis not present

## 2023-10-19 DIAGNOSIS — R911 Solitary pulmonary nodule: Secondary | ICD-10-CM

## 2023-10-19 DIAGNOSIS — M25559 Pain in unspecified hip: Secondary | ICD-10-CM | POA: Insufficient documentation

## 2023-10-19 DIAGNOSIS — M25551 Pain in right hip: Secondary | ICD-10-CM

## 2023-10-19 DIAGNOSIS — Z1211 Encounter for screening for malignant neoplasm of colon: Secondary | ICD-10-CM

## 2023-10-19 LAB — LIPID PANEL
Cholesterol: 168 mg/dL (ref 0–200)
HDL: 49.5 mg/dL (ref >39.00)
LDL Cholesterol: 98 mg/dL (ref 0–99)
NonHDL: 118.44
Total CHOL/HDL Ratio: 3
Triglycerides: 102 mg/dL (ref 0.0–149.0)
VLDL: 20.4 mg/dL (ref 0.0–40.0)

## 2023-10-19 LAB — BASIC METABOLIC PANEL
BUN: 15 mg/dL (ref 6–23)
CO2: 27 meq/L (ref 19–32)
Calcium: 9.2 mg/dL (ref 8.4–10.5)
Chloride: 103 meq/L (ref 96–112)
Creatinine, Ser: 0.88 mg/dL (ref 0.40–1.50)
GFR: 98.86 mL/min (ref >60.00)
Glucose, Bld: 116 mg/dL — ABNORMAL HIGH (ref 70–99)
Potassium: 4.2 meq/L (ref 3.5–5.1)
Sodium: 138 meq/L (ref 135–145)

## 2023-10-19 LAB — HEPATIC FUNCTION PANEL
ALT: 38 U/L (ref 0–53)
AST: 22 U/L (ref 0–37)
Albumin: 4.7 g/dL (ref 3.5–5.2)
Alkaline Phosphatase: 89 U/L (ref 39–117)
Bilirubin, Direct: 0.1 mg/dL (ref 0.0–0.3)
Total Bilirubin: 0.5 mg/dL (ref 0.2–1.2)
Total Protein: 6.9 g/dL (ref 6.0–8.3)

## 2023-10-19 LAB — HEMOGLOBIN A1C: Hgb A1c MFr Bld: 6 % (ref 4.6–6.5)

## 2023-10-19 NOTE — Assessment & Plan Note (Signed)
Colonoscopy 01/22/22 -  Few polyps removed.  Tubular adenomas.  F/u colonoscopy 3 years.  

## 2023-10-19 NOTE — Progress Notes (Addendum)
Subjective:    Patient ID: Alex Peterson, male    DOB: 15-Mar-1971, 52 y.o.   MRN: [id number]  Patient here for  Chief Complaint  Patient presents with   Medical Management of Chronic Issues    HPI Here to follow up regarding hypercholesterolemia and hyperglycemia. Had calcium score 11/2021.  CT chest - Small pulmonary nodules measuring 2-5 mm in size, nonspecific, but statistically likely benign. Recommended f/u CT chest one year. Wanted to hold. Discussed today. Agreeable to schedule after first of the year. No chest pain or sob reported. Is walking daily. Plays soccer. Has noticed some right hip/groin discomfort. Appears to notice more after increased activity. Increased pain with trying to put his shoes on. Feels has worsened recently - trying to compensate. No abdominal pain or bowel change reported.    Past Medical History:  Diagnosis Date   Hypercholesterolemia    Past Surgical History:  Procedure Laterality Date   COLONOSCOPY WITH PROPOFOL N/A 01/22/2022   Procedure: COLONOSCOPY WITH PROPOFOL;  Surgeon: Wyline Mood, MD;  Location: [facility] ENDOSCOPY;  Service: Gastroenterology;  Laterality: N/A;   HERNIA REPAIR     VASECTOMY     Family History  Problem Relation Age of Onset   Varicose Veins Mother    Miscarriages / [place] Mother    Thyroid disease Mother    Heart disease Father    Stroke Father    Thyroid disease Sister    Thyroid disease Sister    Cancer Sister        ovarian cancer   Heart attack Maternal Grandmother    Thyroid disease Maternal Grandmother    Cancer Maternal Grandfather        liver cancer   Heart attack Paternal Grandmother    Social History   Socioeconomic History   Marital status: Married    Spouse name: Not on file   Number of children: 3   Years of education: Not on file   Highest education level: Bachelor's degree (e.g., BA, AB, BS)  Occupational History   Not on file  Tobacco Use   Smoking status: Never   Smokeless tobacco: Never   Substance and Sexual Activity   Alcohol use: Yes    Comment: occasional   Drug use: No   Sexual activity: Yes  Other Topics Concern   Not on file  Social History Narrative   Not on file   Social Drivers of Health   Financial Resource Strain: Low Risk  (12/09/2023)   Received from [facility]   Overall Financial Resource Strain (CARDIA)    Difficulty of Paying Living Expenses: Not very hard  Food Insecurity: No Food Insecurity (12/09/2023)   Received from [facility]   Hunger Vital Sign    Worried About Running Out of Food in the Last Year: Never true    Ran Out of Food in the Last Year: Never true  Transportation Needs: No Transportation Needs (12/09/2023)   [facility] from [facility] - Transportation    In the past 12 months, has lack of transportation kept you from medical appointments or from getting medications?: No    Lack of Transportation (Non-Medical): No  Physical Activity: Insufficiently Active (10/15/2023)   Exercise Vital Sign    Days of Exercise per Week: 3 days    Minutes of Exercise per Session: 20 min  Stress: No Stress Concern Present (10/15/2023)   [employer] of Occupational Health - Occupational Stress Questionnaire  Feeling of Stress : Only a little  Social Connections: Moderately Integrated (10/15/2023)   Social Connection and Isolation Panel [NHANES]    Frequency of Communication with Friends and Family: Once a week    Frequency of Social Gatherings with Friends and Family: Twice a week    Attends Religious Services: 1 to 4 times per year    Active Member of [employer] or Organizations: No    Attends Engineer, structural: Not on file    Marital Status: Married     Review of Systems  Constitutional:  Negative for appetite change and unexpected weight change.  HENT:  Negative for congestion and sinus pressure.   Respiratory:  Negative for cough, chest tightness and shortness of  breath.   Cardiovascular:  Negative for chest pain, palpitations and leg swelling.  Gastrointestinal:  Negative for abdominal pain, diarrhea, nausea and vomiting.  Genitourinary:  Negative for difficulty urinating and dysuria.  Musculoskeletal:  Negative for joint swelling and myalgias.       Right hip pain as outlined.   Skin:  Negative for color change and rash.  Neurological:  Negative for dizziness and headaches.  Psychiatric/Behavioral:  Negative for agitation and dysphoric mood.        Objective:     BP 118/72   Pulse 96   Temp 98 F (36.7 C)   Resp 16   Ht 5\' 11"  (1.803 m)   Wt 229 lb 6.4 oz (104.1 kg)   SpO2 99%   BMI 31.99 kg/m  Wt Readings from Last 3 Encounters:  10/19/23 229 lb 6.4 oz (104.1 kg)  04/17/23 221 lb 9.6 oz (100.5 kg)  11/28/22 228 lb (103.4 kg)    Physical Exam Vitals reviewed.  Constitutional:      General: He is not in acute distress.    Appearance: Normal appearance. He is well-developed.  HENT:     Head: Normocephalic and atraumatic.     Right Ear: External ear normal.     Left Ear: External ear normal.     Mouth/Throat:     Pharynx: No oropharyngeal exudate or posterior oropharyngeal erythema.  Eyes:     General: No scleral icterus.       Right eye: No discharge.        Left eye: No discharge.     Conjunctiva/sclera: Conjunctivae normal.  Cardiovascular:     Rate and Rhythm: Normal rate and regular rhythm.  Pulmonary:     Effort: Pulmonary effort is normal. No respiratory distress.     Breath sounds: Normal breath sounds.  Abdominal:     General: Bowel sounds are normal.     Palpations: Abdomen is soft.     Tenderness: There is no abdominal tenderness.  Musculoskeletal:        General: No swelling or tenderness.     Cervical back: Neck supple. No tenderness.     Comments: Increased pain  - right groin  with abduction/adduction of right lower leg. Good rom.   Lymphadenopathy:     Cervical: No cervical adenopathy.  Skin:     Findings: No erythema or rash.     Comments: No hernia appreciated.   Neurological:     Mental Status: He is alert.  Psychiatric:        Mood and Affect: Mood normal.        Behavior: Behavior normal.      Outpatient Encounter Medications as of 10/19/2023  Medication Sig   finasteride (PROPECIA) 1 MG tablet  TAKE 1 TABLET BY MOUTH EVERY DAY   rosuvastatin (CRESTOR) 40 MG tablet Take 1 tablet (40 mg total) by mouth daily.   No facility-administered encounter medications on file as of 10/19/2023.     Lab Results  Component Value Date   WBC 7.5 06/17/2023   HGB 15.0 06/17/2023   HCT 45.2 06/17/2023   PLT 277.0 06/17/2023   GLUCOSE 116 (H) 10/19/2023   CHOL 168 10/19/2023   TRIG 102.0 10/19/2023   HDL 49.50 10/19/2023   LDLCALC 98 10/19/2023   ALT 38 10/19/2023   AST 22 10/19/2023   NA 138 10/19/2023   K 4.2 10/19/2023   CL 103 10/19/2023   CREATININE 0.88 10/19/2023   BUN 15 10/19/2023   CO2 27 10/19/2023   TSH 1.46 01/23/2023   PSA 0.30 01/23/2023   HGBA1C 6.0 10/19/2023    No results found.     Assessment & Plan:  Hypercholesterolemia Assessment & Plan: On crestor at 20mg  q day.  Follow lipid panel and liver function tests.    Orders: -     Basic metabolic panel -     Hepatic function panel -     Lipid panel  Hyperglycemia Assessment & Plan: Low carb diet and exercise.  Follow met b and A1c.   Orders: -     Basic metabolic panel -     Hemoglobin A1c  Need for influenza vaccination -     Flu vaccine trivalent PF, 6mos and older(Flulaval,Afluria,Fluarix,Fluzone)  Colon cancer screening Assessment & Plan: Colonoscopy 01/22/22 -  Few polyps removed.  Tubular adenomas.  F/u colonoscopy 3 years.    Pulmonary nodules Assessment & Plan: Had calcium score 11/2021.  CT chest - Small pulmonary nodules measuring 2-5 mm in size, nonspecific, but statistically likely benign. Recommended f/u CT chest one year. Wanted to hold. Discussed today. Agreeable to  schedule after first of the year.  Orders: -     CT CHEST WO CONTRAST; Future  Pain of right hip Assessment & Plan: Right hip pain as outlined. Exam as outlined. No hernia appreciated. Certain movement/positions aggravate. Will check xray. Discussed PT.  Follow.   Orders: -     DG HIP UNILAT W OR W/O PELVIS 2-3 VIEWS RIGHT; Future  Lung nodule -     Ambulatory referral to Pulmonology     Dale Lumberton, MD

## 2023-10-19 NOTE — Assessment & Plan Note (Signed)
Had calcium score 11/2021.  CT chest - Small pulmonary nodules measuring 2-5 mm in size, nonspecific, but statistically likely benign. Recommended f/u CT chest one year. Wanted to hold. Discussed today. Agreeable to schedule after first of the year.

## 2023-10-19 NOTE — Assessment & Plan Note (Signed)
Right hip pain as outlined. Exam as outlined. No hernia appreciated. Certain movement/positions aggravate. Will check xray. Discussed PT.  Follow.

## 2023-10-19 NOTE — Assessment & Plan Note (Signed)
On crestor at 20mg  q day.  Follow lipid panel and liver function tests.

## 2023-10-19 NOTE — Assessment & Plan Note (Signed)
Low-carb diet and exercise.  Follow met b and A1c. ?

## 2023-10-20 ENCOUNTER — Telehealth: Payer: Self-pay

## 2023-10-20 NOTE — Telephone Encounter (Signed)
Lvm for pt to give office a call back in regards to lab results

## 2023-10-20 NOTE — Telephone Encounter (Signed)
Pt called stating he is still taking the crestor daily

## 2023-10-20 NOTE — Telephone Encounter (Signed)
-----   Message from Winnett sent at 10/20/2023  4:43 AM EST ----- Notify - cholesterol overall relatively stable. Confirm taking crestor 40mg  daily. Overall sugar control slightly increased from previous check.  Recommend low carb diet and exercise.  We will follow. Kidney function tests and liver function tests are wnl.

## 2023-10-20 NOTE — Telephone Encounter (Signed)
Noted  

## 2023-11-06 ENCOUNTER — Ambulatory Visit: Payer: 59

## 2023-11-06 ENCOUNTER — Other Ambulatory Visit: Payer: 59

## 2023-11-06 DIAGNOSIS — M25551 Pain in right hip: Secondary | ICD-10-CM

## 2023-11-11 ENCOUNTER — Telehealth: Payer: Self-pay | Admitting: Internal Medicine

## 2023-11-11 NOTE — Telephone Encounter (Signed)
 Lft pt vm to call ofc to sch CT. thanks

## 2023-11-13 ENCOUNTER — Telehealth: Payer: Self-pay | Admitting: Internal Medicine

## 2023-11-13 NOTE — Telephone Encounter (Signed)
 Lft pt vm to call ofc to sch CT. thanks

## 2023-11-17 ENCOUNTER — Telehealth: Payer: Self-pay | Admitting: Internal Medicine

## 2023-11-17 NOTE — Telephone Encounter (Signed)
 Lft pt vm to call ofc to sch CT. thanks

## 2023-11-20 ENCOUNTER — Encounter: Payer: Self-pay | Admitting: Internal Medicine

## 2023-11-20 DIAGNOSIS — M25551 Pain in right hip: Secondary | ICD-10-CM

## 2023-11-24 NOTE — Telephone Encounter (Signed)
Order placed for ortho referral.   

## 2023-12-01 ENCOUNTER — Ambulatory Visit
Admission: RE | Admit: 2023-12-01 | Discharge: 2023-12-01 | Disposition: A | Discharge status: Home / Self Care | Payer: 59 | Source: Ambulatory Visit | Attending: Internal Medicine | Admitting: Internal Medicine

## 2023-12-01 DIAGNOSIS — R918 Other nonspecific abnormal finding of lung field: Secondary | ICD-10-CM | POA: Diagnosis present

## 2023-12-11 ENCOUNTER — Telehealth: Payer: Self-pay

## 2023-12-11 NOTE — Telephone Encounter (Signed)
 Copied from CRM [id number]. Topic: General - Other >> Dec 11, 2023  2:30 PM Trula Gable C wrote: Reason for CRM: Patient called in returning a call from Joanathan Affeldt, is requesting a callback

## 2023-12-14 NOTE — Telephone Encounter (Signed)
 LM for patient

## 2023-12-15 ENCOUNTER — Telehealth: Payer: Self-pay | Admitting: Internal Medicine

## 2023-12-15 NOTE — Telephone Encounter (Signed)
See result note.

## 2023-12-15 NOTE — Telephone Encounter (Signed)
   Copied from CRM [id number]. Topic: General - Call Back - No Documentation >> Dec 15, 2023  1:28 PM Alex Peterson wrote: Reason for CRM: Patient returning missed call - patient wanted to speak with someone in the office, he is unsure what the calls are for and would prefer Peterson detailed voicemail next time.

## 2023-12-16 NOTE — Addendum Note (Signed)
Addended by: Charm Barges on: 12/16/2023 09:09 PM   Modules accepted: Orders

## 2023-12-23 ENCOUNTER — Institutional Professional Consult (permissible substitution): Payer: 59 | Admitting: Pulmonary Disease

## 2023-12-29 ENCOUNTER — Ambulatory Visit: Payer: 59 | Admitting: Pulmonary Disease

## 2023-12-29 ENCOUNTER — Encounter: Payer: Self-pay | Admitting: Pulmonary Disease

## 2023-12-29 VITALS — BP 134/84 | HR 95 | Temp 96.9°F | Ht 71.0 in | Wt 235.4 lb

## 2023-12-29 DIAGNOSIS — R918 Other nonspecific abnormal finding of lung field: Secondary | ICD-10-CM

## 2023-12-29 NOTE — Patient Instructions (Signed)
 VISIT SUMMARY:  Today, we discussed the lung nodules that were found incidentally during your cardiac scan in January 2023. These nodules are very small, and you have no symptoms such as shortness of breath or cough. You have no history of smoking, and your lungs are clear upon examination. The radiologist has assessed these nodules as benign, and there is no immediate cause for concern.  YOUR PLAN:  -PULMONARY NODULES: Pulmonary nodules are small, round growths in the lungs that are often found incidentally during scans for other issues. In your case, the nodules are very small and have been assessed as benign, meaning they are not cancerous. Since you have no symptoms and no significant risk factors, we do not need to take any immediate action. However, we will schedule a follow-up scan in approximately 12-18 months to monitor the nodules and ensure they remain unchanged. Please keep an eye out for any new symptoms such as shortness of breath or cough and report them if they occur.  INSTRUCTIONS:  A  follow-up scan has been ordered for approximately 12-18 months to monitor the lung nodules. Additionally, monitor for any new symptoms such as shortness of breath or cough and report them if they occur.

## 2023-12-29 NOTE — Progress Notes (Signed)
 Subjective:    Patient ID: Alex Peterson, male    DOB: [birth year], 53 y.o.   MRN: [id number]  Patient Care Team: Dale Hinsdale, MD as PCP - General (Internal Medicine) Salena Saner, MD as Consulting Physician (Pulmonary Disease)  Chief Complaint  Patient presents with   Consult    No SOB, wheezing or cough.     BACKGROUND: 53 year old lifelong never smoker with incidental lung nodules.  HPI Discussed the use of AI scribe software for clinical note transcription with the patient, who gave verbal consent to proceed.  History of Present Illness   Alex Peterson is a 53 year old male who presents for evaluation of lung nodules found on a cardiac scan.  Lung nodules were incidentally discovered during a cardiac scoring scan in January 2023. The nodules are described as very small, approximately the size of a well-sharpened crayon tip (2 to 5 mm in size). He is unsure if further evaluation was recommended due to the findings on the scan.  A follow-up CT chest was performed 01 December 2023 this shows stability of these nodules.  There was a "new nodule" lower lobe described as "tiny/punctate nodule" that was not included in the prior views which were limited by the cardiac bias of the study.  This is also likely benign.  No respiratory symptoms such as shortness of breath, cough, or history of asthma. He has no history of smoking, although he did smoke cigars briefly about twenty years ago.  His social history includes working in [employer] and living in various locations including [place], [place], [place], and his current residence. He reports no significant exposure to environmental hazards that could affect his lung health.     Review of Systems A 10 point review of systems was performed and it is as noted above otherwise negative.   Past Medical History:  Diagnosis Date   Hypercholesterolemia     Past Surgical History:  Procedure Laterality Date   COLONOSCOPY WITH  PROPOFOL N/A 01/22/2022   Procedure: COLONOSCOPY WITH PROPOFOL;  Surgeon: Wyline Mood, MD;  Location: [facility] ENDOSCOPY;  Service: Gastroenterology;  Laterality: N/A;   HERNIA REPAIR     VASECTOMY      Patient Active Problem List   Diagnosis Date Noted   Hip pain 10/19/2023   Hyperglycemia 04/17/2023   Elevated blood pressure reading 11/29/2022   Change in vision 11/29/2022   White coat syndrome without diagnosis of hypertension 03/24/2022   Elevated ALT measurement 03/24/2022   Elevated fasting glucose 03/24/2022   Ganglion cyst of tendon sheath of right hand 03/24/2022   Pulmonary nodules 11/19/2021   Healthcare maintenance 10/29/2021   SOB (shortness of breath) on exertion 10/23/2021   Hypercholesterolemia 10/23/2021   Colon cancer screening 02/09/2021   Prostate cancer screening 02/09/2021   Allergies 02/09/2021   Pain due to varicose veins of lower extremity 03/09/2017   Varicose veins of bilateral lower extremities with pain 08/26/2016    Family History  Problem Relation Age of Onset   Varicose Veins Mother    Miscarriages / [place] Mother    Thyroid disease Mother    Heart disease Father    Stroke Father    Thyroid disease Sister    Thyroid disease Sister    Cancer Sister        ovarian cancer   Heart attack Maternal Grandmother    Thyroid disease Maternal Grandmother    Cancer Maternal Grandfather        liver cancer  Heart attack Paternal Grandmother     Social History   Tobacco Use   Smoking status: Never   Smokeless tobacco: Never  Substance Use Topics   Alcohol use: Yes    Comment: occasional    No Known Allergies  Current Meds  Medication Sig   finasteride (PROPECIA) 1 MG tablet TAKE 1 TABLET BY MOUTH EVERY DAY   rosuvastatin (CRESTOR) 40 MG tablet Take 1 tablet (40 mg total) by mouth daily.    Immunization History  Administered Date(s) Administered   Influenza Inj Mdck Quad Pf 09/20/2022   Influenza, Seasonal, Injecte, Preservative Fre  10/19/2023   Influenza-Unspecified 08/03/2020, 09/03/2021   PFIZER(Purple Top)SARS-COV-2 Vaccination 11/17/2019, 12/08/2019, 08/03/2020        Objective:     BP 134/84 (BP Location: Right Arm, Cuff Size: Large)   Pulse 95   Temp (!) 96.9 F (36.1 C)   Ht 5\' 11"  (1.803 m)   Wt 235 lb 6.4 oz (106.8 kg)   SpO2 96%   BMI 32.83 kg/m   SpO2: 96 % O2 Device: None (Room air)  GENERAL: Well-developed, well-nourished gentleman, no acute distress. HEAD: Normocephalic, atraumatic.  EYES: Pupils equal, round, reactive to light.  No scleral icterus.  MOUTH: Dentition intact. NECK: Supple. No thyromegaly. Trachea midline. No JVD.  No adenopathy. PULMONARY: Good air entry bilaterally.  No adventitious sounds. CARDIOVASCULAR: S1 and S2. Regular rate and rhythm.  No rubs, murmurs or gallops heard. ABDOMEN: Benign. MUSCULOSKELETAL: No joint deformity, no clubbing, no edema.  NEUROLOGIC: No overt focal deficit, no gait disturbance, speech is fluent. SKIN: Intact,warm,dry. PSYCH: Mood and behavior normal.   Representative image from formed 01 December 2023 showing the punctate nodule on the left lower lobe (arrow):    Assessment & Plan:     ICD-10-CM   1. Multiple lung nodules on CT  R91.8 CT CHEST WO CONTRAST      Orders Placed This Encounter  Procedures   CT CHEST WO CONTRAST    In one year    Standing Status:   Future    Expected Date:   12/19/2024    Expiration Date:   12/28/2024    Preferred imaging location?:   [facility]   Discussion:    Pulmonary Nodules Pulmonary nodules identified incidentally during a cardiac scoring scan in January 2023. Nodules are very small, approximately the size of a well-sharpened crayon tip. No symptoms such as dyspnea, cough, or asthma. Lungs clear on auscultation. Radiologist assessed the nodules as benign. Patient is asymptomatic, non-smoker, and not overly concerned. Discussed environmental exposure as a potential cause. Explained  benign nature and lack of need for immediate intervention. Patient opted for follow-up scan for reassurance. - Schedule follow-up scan in approximately 12-18 months - Monitor for new symptoms such as dyspnea or cough.      Advised if symptoms do not improve or worsen, to please contact office for sooner follow up or seek emergency care.    I spent 32 minutes of dedicated to the care of this patient on the date of this encounter to include pre-visit review of records, face-to-face time with the patient discussing conditions above, post visit ordering of testing, clinical documentation with the electronic health record, making appropriate referrals as documented, and communicating necessary findings to members of the patients care team.   C. Danice Goltz, MD Advanced Bronchoscopy P[facility]-    *This note was dictated using voice recognition software/Dragon.  Despite best efforts to proofread, errors can occur which  can change the meaning. Any transcriptional errors that result from this process are unintentional and may not be fully corrected at the time of dictation.

## 2024-01-28 ENCOUNTER — Other Ambulatory Visit: Payer: Self-pay | Admitting: Internal Medicine

## 2024-04-01 ENCOUNTER — Other Ambulatory Visit: Payer: Self-pay | Admitting: Orthopedic Surgery

## 2024-04-18 ENCOUNTER — Ambulatory Visit: Payer: BC Managed Care – PPO | Admitting: Internal Medicine

## 2024-04-18 ENCOUNTER — Encounter: Payer: Self-pay | Admitting: Internal Medicine

## 2024-04-18 VITALS — BP 128/76 | HR 79 | Temp 98.0°F | Resp 16 | Ht 71.0 in | Wt 216.0 lb

## 2024-04-18 DIAGNOSIS — Z125 Encounter for screening for malignant neoplasm of prostate: Secondary | ICD-10-CM | POA: Diagnosis not present

## 2024-04-18 DIAGNOSIS — Z01818 Encounter for other preprocedural examination: Secondary | ICD-10-CM | POA: Diagnosis not present

## 2024-04-18 DIAGNOSIS — Z0000 Encounter for general adult medical examination without abnormal findings: Secondary | ICD-10-CM

## 2024-04-18 DIAGNOSIS — R918 Other nonspecific abnormal finding of lung field: Secondary | ICD-10-CM

## 2024-04-18 DIAGNOSIS — E7800 Pure hypercholesterolemia, unspecified: Secondary | ICD-10-CM | POA: Diagnosis not present

## 2024-04-18 DIAGNOSIS — Z1211 Encounter for screening for malignant neoplasm of colon: Secondary | ICD-10-CM

## 2024-04-18 DIAGNOSIS — R739 Hyperglycemia, unspecified: Secondary | ICD-10-CM | POA: Diagnosis not present

## 2024-04-18 DIAGNOSIS — M25551 Pain in right hip: Secondary | ICD-10-CM

## 2024-04-18 NOTE — Assessment & Plan Note (Signed)
 Had calcium  score 11/2021.  CT chest - Small pulmonary nodules measuring 2-5 mm in size, nonspecific, but statistically likely benign. Recommended f/u CT chest one year. CT chest 12/2023 - Small pulmonary nodules are stable from 2023 exam consistent with definitively benign process. A tiny punctate left lower lobe nodule was not previously included in the field of view, however likely benign. For low risk patients, no further imaging follow-up is recommended. Saw pulmonary. Felt stable. Recommended f/u in 12-18 months.

## 2024-04-18 NOTE — Assessment & Plan Note (Signed)
 Continue crestor . Check lipid panel and liver function tests today. Low cholesterol diet and exercise.

## 2024-04-18 NOTE — Assessment & Plan Note (Signed)
Colonoscopy 01/22/22 -  Few polyps removed.  Tubular adenomas.  F/u colonoscopy 3 years.  

## 2024-04-18 NOTE — Progress Notes (Unsigned)
 Subjective:    Patient ID: Alex Peterson, male    DOB: 02/16/1971, 53 y.o.   MRN: [id number]  Patient here for  Chief Complaint  Patient presents with   Annual Exam   Pre-op Exam    HPI Here for his physical exam.  Also needs pre op evaluation. Seeing Dr Clyda Dark - right hip OA. Planning right total hip replacement. He is doing well - except for his hip. He is limited in his activity, but is able to walk the dogs and do activity around the house without chest pain or sob. No cough or congestion. No abdominal pain or bowel change. Handling stress relatively well of upcoming surgery.    Past Medical History:  Diagnosis Date   Hypercholesterolemia    Past Surgical History:  Procedure Laterality Date   COLONOSCOPY WITH PROPOFOL  N/A 01/22/2022   Procedure: COLONOSCOPY WITH PROPOFOL ;  Surgeon: Luke Salaam, MD;  Location: [facility] ENDOSCOPY;  Service: Gastroenterology;  Laterality: N/A;   HERNIA REPAIR     VASECTOMY     Family History  Problem Relation Age of Onset   Varicose Veins Mother    Miscarriages / [place] Mother    Thyroid  disease Mother    Heart disease Father    Stroke Father    Thyroid  disease Sister    Thyroid  disease Sister    Cancer Sister        ovarian cancer   Heart attack Maternal Grandmother    Thyroid  disease Maternal Grandmother    Cancer Maternal Grandfather        liver cancer   Heart attack Paternal Grandmother    Social History   Socioeconomic History   Marital status: Married    Spouse name: Not on file   Number of children: 3   Years of education: Not on file   Highest education level: Bachelor's degree (e.g., BA, AB, BS)  Occupational History   Not on file  Tobacco Use   Smoking status: Never   Smokeless tobacco: Never  Substance and Sexual Activity   Alcohol use: Yes    Comment: occasional   Drug use: No   Sexual activity: Yes  Other Topics Concern   Not on file  Social History Narrative   Not on file   Social Drivers of Health    Financial Resource Strain: Low Risk  (04/15/2024)   Overall Financial Resource Strain (CARDIA)    Difficulty of Paying Living Expenses: Not very hard  Food Insecurity: No Food Insecurity (04/15/2024)   Hunger Vital Sign    Worried About Running Out of Food in the Last Year: Never true    Ran Out of Food in the Last Year: Never true  Transportation Needs: No Transportation Needs (04/15/2024)   PRAPARE - Administrator, Civil Service (Medical): No    Lack of Transportation (Non-Medical): No  Physical Activity: Sufficiently Active (04/15/2024)   Exercise Vital Sign    Days of Exercise per Week: 5 days    Minutes of Exercise per Session: 30 min  Stress: No Stress Concern Present (04/15/2024)   Alex Peterson of Occupational Health - Occupational Stress Questionnaire    Feeling of Stress: Only a little  Social Connections: Moderately Integrated (04/15/2024)   Social Connection and Isolation Panel    Frequency of Communication with Friends and Family: More than three times a week    Frequency of Social Gatherings with Friends and Family: Twice a week    Attends Religious Services: 1 to 4  times per year    Active Member of Clubs or Organizations: No    Attends Banker Meetings: Not on file    Marital Status: Married     Review of Systems  Constitutional:  Negative for appetite change and unexpected weight change.  HENT:  Negative for congestion, sinus pressure and sore throat.   Eyes:  Negative for pain and visual disturbance.  Respiratory:  Negative for cough, chest tightness and shortness of breath.   Cardiovascular:  Negative for chest pain, palpitations and leg swelling.  Gastrointestinal:  Negative for abdominal pain, diarrhea, nausea and vomiting.  Genitourinary:  Negative for difficulty urinating and dysuria.  Musculoskeletal:  Negative for joint swelling and myalgias.       Right hip pain.   Skin:  Negative for color change and rash.  Neurological:   Negative for dizziness and headaches.  Hematological:  Negative for adenopathy. Does not bruise/bleed easily.  Psychiatric/Behavioral:  Negative for agitation and dysphoric mood.        Objective:     BP 128/76   Pulse 79   Temp 98 F (36.7 C)   Resp 16   Ht 5' 11 (1.803 m)   Wt 216 lb (98 kg)   SpO2 98%   BMI 30.13 kg/m  Wt Readings from Last 3 Encounters:  04/18/24 216 lb (98 kg)  12/29/23 235 lb 6.4 oz (106.8 kg)  10/19/23 229 lb 6.4 oz (104.1 kg)    Physical Exam Constitutional:      General: He is not in acute distress.    Appearance: Normal appearance. He is well-developed.  HENT:     Head: Normocephalic and atraumatic.     Right Ear: External ear normal.     Left Ear: External ear normal.     Mouth/Throat:     Pharynx: No oropharyngeal exudate or posterior oropharyngeal erythema.   Eyes:     General: No scleral icterus.       Right eye: No discharge.        Left eye: No discharge.     Conjunctiva/sclera: Conjunctivae normal.   Neck:     Thyroid : No thyromegaly.   Cardiovascular:     Rate and Rhythm: Normal rate and regular rhythm.  Pulmonary:     Effort: No respiratory distress.     Breath sounds: Normal breath sounds. No wheezing.  Abdominal:     General: Bowel sounds are normal.     Palpations: Abdomen is soft.     Tenderness: There is no abdominal tenderness.   Musculoskeletal:        General: No swelling or tenderness.     Cervical back: Neck supple. No tenderness.  Lymphadenopathy:     Cervical: No cervical adenopathy.   Skin:    Findings: No erythema or rash.   Neurological:     Mental Status: He is alert and oriented to person, place, and time.   Psychiatric:        Mood and Affect: Mood normal.        Behavior: Behavior normal.         Outpatient Encounter Medications as of 04/18/2024  Medication Sig   finasteride  (PROPECIA ) 1 MG tablet TAKE 1 TABLET BY MOUTH EVERY DAY   rosuvastatin  (CRESTOR ) 40 MG tablet TAKE 1 TABLET BY  MOUTH EVERY DAY   No facility-administered encounter medications on file as of 04/18/2024.     Lab Results  Component Value Date   WBC 7.5 06/17/2023  HGB 15.0 06/17/2023   HCT 45.2 06/17/2023   PLT 277.0 06/17/2023   GLUCOSE 114 (H) 04/18/2024   CHOL 147 04/18/2024   TRIG 101 04/18/2024   HDL 51 04/18/2024   LDLCALC 77 04/18/2024   ALT 44 04/18/2024   AST 27 04/18/2024   NA 139 04/18/2024   K 4.3 04/18/2024   CL 104 04/18/2024   CREATININE 0.89 04/18/2024   BUN 13 04/18/2024   CO2 21 04/18/2024   TSH 1.040 04/18/2024   PSA 0.30 01/23/2023   HGBA1C 5.5 04/18/2024    CT Chest Wo Contrast Result Date: 12/10/2023 CLINICAL DATA:  Pulmonary nodule follow-up. EXAM: CT CHEST WITHOUT CONTRAST TECHNIQUE: Multidetector CT imaging of the chest was performed following the standard protocol without IV contrast. RADIATION DOSE REDUCTION: This exam was performed according to the departmental dose-optimization program which includes automated exposure control, adjustment of the mA and/or kV according to patient size and/or use of iterative reconstruction technique. COMPARISON:  Cardiac CT 11/15/2021. FINDINGS: Cardiovascular: The heart is normal in size. Faint coronary artery calcifications. No pericardial effusion. The thoracic aorta is normal in caliber. Mediastinum/Nodes: Small mediastinal lymph nodes are not enlarged by size criteria. Limited hilar assessment in the absence of IV contrast. The esophagus is decompressed. Lungs/Pleura: Stable 5 mm pleural based right lower lobe nodule series 3, image 72. Additional tiny nodules in the right mid series 3, image 81 and left lower lobe series 3, image 105 are stable. There is a tiny punctate peg Matic left lower lobe nodule series 3, image 133 that was not previously included in the field of view. No acute airspace disease. Trachea and central airways are clear. Upper Abdomen: Decreased hepatic density consistent with steatosis. Musculoskeletal: Thoracic  spondylosis with spurring. There are no acute or suspicious osseous abnormalities. IMPRESSION: 1. Small pulmonary nodules are stable from 2023 exam consistent with definitively benign process. 2. A tiny punctate left lower lobe nodule was not previously included in the field of view, however likely benign. For low risk patients, no further imaging follow-up is recommended. Electronically Signed   By: Chadwick Colonel M.D.   On: 12/10/2023 16:17       Assessment & Plan:  Routine general medical examination at a health care facility  Hypercholesterolemia Assessment & Plan: Continue crestor . Check lipid panel and liver function tests today. Low cholesterol diet and exercise.   Orders: -     Lipid panel -     Hepatic function panel -     Basic metabolic panel with GFR -     TSH  Hyperglycemia Assessment & Plan: Low carb diet and exercise. Follow met b and A1c.   Orders: -     Hemoglobin A1c -     Basic metabolic panel with GFR  Prostate cancer screening Assessment & Plan: Check psa today.   Orders: -     PSA  Pre-op evaluation Assessment & Plan: Plannning for hip surgery as outlined. EKG - SR. Poor R wave progression - precordial leads. Activity as outlined. Is limited some by hip pain. Discussed with cardiology. Will refer for pre op evaluation to confirm if further w/up / evaluation warranted prior to surgery.   Orders: -     EKG 12-Lead -     Ambulatory referral to Cardiology  Healthcare maintenance Assessment & Plan: Physical today 04/17/23. Aaron Aas  PSA 3/22/4 - .30.  Check psa today. Colonoscopy 01/22/22 - recommended f/u in 3 years.    Colon cancer screening Assessment & Plan:  Colonoscopy 01/22/22 -  Few polyps removed.  Tubular adenomas.  F/u colonoscopy 3 years.    Pulmonary nodules Assessment & Plan: Had calcium  score 11/2021.  CT chest - Small pulmonary nodules measuring 2-5 mm in size, nonspecific, but statistically likely benign. Recommended f/u CT chest one year.  CT chest 12/2023 - Small pulmonary nodules are stable from 2023 exam consistent with definitively benign process. A tiny punctate left lower lobe nodule was not previously included in the field of view, however likely benign. For low risk patients, no further imaging follow-up is recommended. Saw pulmonary. Felt stable. Recommended f/u in 12-18 months.    Pain of right hip Assessment & Plan: Persistent right hip pain. Seeing Dr Clyda Dark. Recommended hip surgery. Planning for 05/19/24.       Dellar Fenton, MD

## 2024-04-18 NOTE — Assessment & Plan Note (Signed)
 Physical today 04/17/23. Alex Peterson  PSA 3/22/4 - .30.  Check psa today. Colonoscopy 01/22/22 - recommended f/u in 3 years.

## 2024-04-18 NOTE — Assessment & Plan Note (Signed)
 Plannning for hip surgery as outlined. EKG - SR. Poor R wave progression - precordial leads. Activity as outlined. Is limited some by hip pain. Discussed with cardiology. Will refer for pre op evaluation to confirm if further w/up / evaluation warranted prior to surgery.

## 2024-04-18 NOTE — Assessment & Plan Note (Signed)
 Low-carb diet and exercise.  Follow met b and A1c.

## 2024-04-18 NOTE — Assessment & Plan Note (Signed)
Check psa today 

## 2024-04-19 ENCOUNTER — Ambulatory Visit: Payer: Self-pay | Admitting: Internal Medicine

## 2024-04-19 LAB — BASIC METABOLIC PANEL WITH GFR
BUN/Creatinine Ratio: 15 (ref 9–20)
BUN: 13 mg/dL (ref 6–24)
CO2: 21 mmol/L (ref 20–29)
Calcium: 9.1 mg/dL (ref 8.7–10.2)
Chloride: 104 mmol/L (ref 96–106)
Creatinine, Ser: 0.89 mg/dL (ref 0.76–1.27)
Glucose: 114 mg/dL — ABNORMAL HIGH (ref 70–99)
Potassium: 4.3 mmol/L (ref 3.5–5.2)
Sodium: 139 mmol/L (ref 134–144)
eGFR: 102 mL/min/{1.73_m2} (ref >59)

## 2024-04-19 LAB — HEPATIC FUNCTION PANEL
ALT: 44 IU/L (ref 0–44)
AST: 27 IU/L (ref 0–40)
Albumin: 4.5 g/dL (ref 3.8–4.9)
Alkaline Phosphatase: 88 IU/L (ref 44–121)
Bilirubin Total: 0.3 mg/dL (ref 0.0–1.2)
Bilirubin, Direct: 0.11 mg/dL (ref 0.00–0.40)
Total Protein: 6.3 g/dL (ref 6.0–8.5)

## 2024-04-19 LAB — HEMOGLOBIN A1C
Est. average glucose Bld gHb Est-mCnc: 111 mg/dL
Hgb A1c MFr Bld: 5.5 % (ref 4.8–5.6)

## 2024-04-19 LAB — LIPID PANEL
Chol/HDL Ratio: 2.9 ratio (ref 0.0–5.0)
Cholesterol, Total: 147 mg/dL (ref 100–199)
HDL: 51 mg/dL (ref >39)
LDL Chol Calc (NIH): 77 mg/dL (ref 0–99)
Triglycerides: 101 mg/dL (ref 0–149)
VLDL Cholesterol Cal: 19 mg/dL (ref 5–40)

## 2024-04-19 LAB — PSA: Prostate Specific Ag, Serum: 0.4 ng/mL (ref 0.0–4.0)

## 2024-04-19 LAB — TSH: TSH: 1.04 u[IU]/mL (ref 0.450–4.500)

## 2024-04-21 ENCOUNTER — Encounter: Payer: Self-pay | Admitting: Internal Medicine

## 2024-04-21 NOTE — Assessment & Plan Note (Signed)
 Persistent right hip pain. Seeing Dr Clyda Dark. Recommended hip surgery. Planning for 05/19/24.

## 2024-04-25 ENCOUNTER — Encounter: Payer: Self-pay | Admitting: Internal Medicine

## 2024-04-25 NOTE — Telephone Encounter (Signed)
 Please call cardiology and let them know that his surgery is scheduled for 05/19/24. He will be out of town next week. Is there anyone that can see him for pre op evaluation. (Had talked to Dr End about his EKG). Let me know if a problem.

## 2024-04-26 ENCOUNTER — Encounter: Payer: Self-pay | Admitting: Internal Medicine

## 2024-04-26 NOTE — Telephone Encounter (Signed)
 Alex Peterson, I am sending this directly to you. I have reached out to get an earlier appt. Please hold this message- they may reach out to you.  Thanks.

## 2024-04-26 NOTE — Telephone Encounter (Signed)
 Per cardiology- patient cannot be seen sooner than 8/20. Patient has been placed on cancellation list. They are aware that you already discussed EKG with Dr End.

## 2024-04-27 NOTE — Telephone Encounter (Signed)
Holding message.

## 2024-04-29 ENCOUNTER — Encounter: Payer: Self-pay | Admitting: Cardiology

## 2024-04-29 ENCOUNTER — Ambulatory Visit: Attending: Cardiology | Admitting: Cardiology

## 2024-04-29 VITALS — BP 118/82 | HR 76 | Ht 71.0 in | Wt 210.6 lb

## 2024-04-29 DIAGNOSIS — E7800 Pure hypercholesterolemia, unspecified: Secondary | ICD-10-CM | POA: Diagnosis not present

## 2024-04-29 DIAGNOSIS — Z01818 Encounter for other preprocedural examination: Secondary | ICD-10-CM

## 2024-04-29 NOTE — Progress Notes (Signed)
 Cardiology Office Note:    Date:  04/29/2024   ID:  Alex Peterson, DOB 05-27-1971, MRN [id number]  PCP:  Glendia Shad, MD   [facility] Providers Cardiologist:  None     Referring MD: Glendia Shad, MD   Chief Complaint  Patient presents with   Pre-op Exam     New pt to establish for pre op for arthroplasty, pt has been doing well with no complaints of chest pain, chest pressure or SOB, medciation reviewed verbally with patient   Alex Peterson is a 53 y.o. male who is being seen today for the evaluation of cardiac risk at the request of Glendia Shad, MD.   History of Present Illness:    Alex Peterson is a 53 y.o. male with a hx of hyperlipidemia who presents for preop evaluation  Patient is planning on having arthroplasty of right hip next month. preop EKG 04/18/2024 noted to have abnormality/anterior R wave progression.  He denies chest pain or shortness of breath, denies smoking, denies any history of heart disease.  Past Medical History:  Diagnosis Date   Hypercholesterolemia     Past Surgical History:  Procedure Laterality Date   COLONOSCOPY WITH PROPOFOL  N/A 01/22/2022   Procedure: COLONOSCOPY WITH PROPOFOL ;  Surgeon: Therisa Bi, MD;  Location: [facility] ENDOSCOPY;  Service: Gastroenterology;  Laterality: N/A;   HERNIA REPAIR     VASECTOMY      Current Medications: Current Meds  Medication Sig   rosuvastatin  (CRESTOR ) 40 MG tablet TAKE 1 TABLET BY MOUTH EVERY DAY     Allergies:   Patient has no known allergies.   Social History   Socioeconomic History   Marital status: Married    Spouse name: Not on file   Number of children: 3   Years of education: Not on file   Highest education level: Bachelor's degree (e.g., BA, AB, BS)  Occupational History   Not on file  Tobacco Use   Smoking status: Never   Smokeless tobacco: Never  Substance and Sexual Activity   Alcohol use: Yes    Comment: occasional   Drug use: No   Sexual activity: Yes  Other Topics  Concern   Not on file  Social History Narrative   Not on file   Social Drivers of Health   Financial Resource Strain: Low Risk  (04/15/2024)   Overall Financial Resource Strain (CARDIA)    Difficulty of Paying Living Expenses: Not very hard  Food Insecurity: No Food Insecurity (04/15/2024)   Hunger Vital Sign    Worried About Running Out of Food in the Last Year: Never true    Ran Out of Food in the Last Year: Never true  Transportation Needs: No Transportation Needs (04/15/2024)   PRAPARE - Administrator, Civil Service (Medical): No    Lack of Transportation (Non-Medical): No  Physical Activity: Sufficiently Active (04/15/2024)   Exercise Vital Sign    Days of Exercise per Week: 5 days    Minutes of Exercise per Session: 30 min  Stress: No Stress Concern Present (04/15/2024)   [employer] of Occupational Health - Occupational Stress Questionnaire    Feeling of Stress: Only a little  Social Connections: Moderately Integrated (04/15/2024)   Social Connection and Isolation Panel    Frequency of Communication with Friends and Family: More than three times a week    Frequency of Social Gatherings with Friends and Family: Twice a week    Attends Religious Services: 1 to 4 times per  year    Active Member of Clubs or Organizations: No    Attends Engineer, structural: Not on file    Marital Status: Married     Family History: The patient's family history includes Cancer in his maternal grandfather and sister; Heart attack in his maternal grandmother and paternal grandmother; Heart disease in his father; Miscarriages / [place] in his mother; Stroke in his father; Thyroid  disease in his maternal grandmother, mother, sister, and sister; Varicose Veins in his mother.  ROS:   Please see the history of present illness.     All other systems reviewed and are negative.  EKGs/Labs/Other Studies Reviewed:    The following studies were reviewed today:  EKG  Interpretation Date/Time:  Friday April 29 2024 10:56:42 EDT Ventricular Rate:  76 PR Interval:  186 QRS Duration:  88 QT Interval:  368 QTC Calculation: 414 R Axis:   27  Text Interpretation: Normal sinus rhythm Normal ECG Confirmed by Darliss Rogue ([id number]) on 04/29/2024 11:23:33 AM    Recent Labs: 06/17/2023: Hemoglobin 15.0; Platelets 277.0 04/18/2024: ALT 44; BUN 13; Creatinine, Ser 0.89; Potassium 4.3; Sodium 139; TSH 1.040  Recent Lipid Panel    Component Value Date/Time   CHOL 147 04/18/2024 0810   TRIG 101 04/18/2024 0810   HDL 51 04/18/2024 0810   CHOLHDL 2.9 04/18/2024 0810   CHOLHDL 3 10/19/2023 0822   VLDL 20.4 10/19/2023 0822   LDLCALC 77 04/18/2024 0810     Risk Assessment/Calculations:             Physical Exam:    VS:  BP 118/82 (BP Location: Left Arm, Patient Position: Sitting, Cuff Size: Normal)   Pulse 76   Ht 5' 11 (1.803 m)   Wt 210 lb 9.6 oz (95.5 kg)   SpO2 98%   BMI 29.37 kg/m     Wt Readings from Last 3 Encounters:  04/29/24 210 lb 9.6 oz (95.5 kg)  04/18/24 216 lb (98 kg)  12/29/23 235 lb 6.4 oz (106.8 kg)     GEN:  Well nourished, well developed in no acute distress HEENT: Normal NECK: No JVD; No carotid bruits CARDIAC: RRR, no murmurs, rubs, gallops RESPIRATORY:  Clear to auscultation without rales, wheezing or rhonchi  ABDOMEN: Soft, non-tender, non-distended MUSCULOSKELETAL:  No edema; No deformity  SKIN: Warm and dry NEUROLOGIC:  Alert and oriented x 3 PSYCHIATRIC:  Normal affect   ASSESSMENT:    1. Pre-op evaluation   2. Pure hypercholesterolemia    PLAN:    In order of problems listed above:  Preop evaluation, right hip arthroplasty being planned.  EKG normal.  Patient is asymptomatic with no symptoms of chest pain or shortness of breath.  No additional cardiac testing indicated as per [facility] AHA guidelines.  Okay to proceed with surgical procedure from a cardiac perspective.  Postop anticoagulation as per orthopedic  service. Hyperlipidemia, cholesterol controlled.  Continue Crestor  40 mg daily.  Follow-up as needed.      Medication Adjustments/Labs and Tests Ordered: Current medicines are reviewed at length with the patient today.  Concerns regarding medicines are outlined above.  Orders Placed This Encounter  Procedures   EKG 12-Lead   No orders of the defined types were placed in this encounter.   Patient Instructions  Medication Instructions:  Your physician recommends that you continue on your current medications as directed. Please refer to the Current Medication list given to you today.   *If you need a refill on your cardiac medications  before your next appointment, please call your pharmacy*  Lab Work: No labs ordered today  If you have labs (blood work) drawn today and your tests are completely normal, you will receive your results only by: MyChart Message (if you have MyChart) OR A paper copy in the mail If you have any lab test that is abnormal or we need to change your treatment, we will call you to review the results.  Testing/Procedures: No test ordered today   Follow-Up: At [facility], you and your health needs are our priority.  As part of our continuing mission to provide you with exceptional heart care, our providers are all part of one team.  This team includes your primary Cardiologist (physician) and Advanced Practice Providers or APPs (Physician Assistants and Nurse Practitioners) who all work together to provide you with the care you need, when you need it.  Your next appointment:   Follow up as needed         Signed, Redell Cave, MD  04/29/2024 11:55 AM    [facility]

## 2024-04-29 NOTE — Patient Instructions (Signed)
 Medication Instructions:  Your physician recommends that you continue on your current medications as directed. Please refer to the Current Medication list given to you today.   *If you need a refill on your cardiac medications before your next appointment, please call your pharmacy*  Lab Work: No labs ordered today  If you have labs (blood work) drawn today and your tests are completely normal, you will receive your results only by: MyChart Message (if you have MyChart) OR A paper copy in the mail If you have any lab test that is abnormal or we need to change your treatment, we will call you to review the results.  Testing/Procedures: No test ordered today   Follow-Up: At [facility], you and your health needs are our priority.  As part of our continuing mission to provide you with exceptional heart care, our providers are all part of one team.  This team includes your primary Cardiologist (physician) and Advanced Practice Providers or APPs (Physician Assistants and Nurse Practitioners) who all work together to provide you with the care you need, when you need it.  Your next appointment:   Follow up as needed

## 2024-04-29 NOTE — Telephone Encounter (Signed)
 FYI patient saw cardiology today.

## 2024-05-09 ENCOUNTER — Encounter
Admission: RE | Admit: 2024-05-09 | Discharge: 2024-05-09 | Disposition: A | Discharge status: Home / Self Care | Source: Ambulatory Visit | Attending: Orthopedic Surgery | Admitting: Orthopedic Surgery

## 2024-05-09 ENCOUNTER — Other Ambulatory Visit: Payer: Self-pay

## 2024-05-09 VITALS — BP 134/82 | HR 84 | Resp 16 | Ht 71.0 in | Wt 217.0 lb

## 2024-05-09 DIAGNOSIS — Z01818 Encounter for other preprocedural examination: Secondary | ICD-10-CM

## 2024-05-09 DIAGNOSIS — Z01812 Encounter for preprocedural laboratory examination: Secondary | ICD-10-CM | POA: Insufficient documentation

## 2024-05-09 LAB — CBC WITH DIFFERENTIAL/PLATELET
Abs Immature Granulocytes: 0.05 K/uL (ref 0.00–0.07)
Basophils Absolute: 0.1 K/uL (ref 0.0–0.1)
Basophils Relative: 1 %
Eosinophils Absolute: 0.1 K/uL (ref 0.0–0.5)
Eosinophils Relative: 2 %
HCT: 44.6 % (ref 39.0–52.0)
Hemoglobin: 15.2 g/dL (ref 13.0–17.0)
Immature Granulocytes: 1 %
Lymphocytes Relative: 17 %
Lymphs Abs: 1.4 K/uL (ref 0.7–4.0)
MCH: 32.1 pg (ref 26.0–34.0)
MCHC: 34.1 g/dL (ref 30.0–36.0)
MCV: 94.3 fL (ref 80.0–100.0)
Monocytes Absolute: 0.6 K/uL (ref 0.1–1.0)
Monocytes Relative: 7 %
Neutro Abs: 5.8 K/uL (ref 1.7–7.7)
Neutrophils Relative %: 72 %
Platelets: 241 K/uL (ref 150–400)
RBC: 4.73 MIL/uL (ref 4.22–5.81)
RDW: 12.6 % (ref 11.5–15.5)
WBC: 7.9 K/uL (ref 4.0–10.5)
nRBC: 0 % (ref 0.0–0.2)

## 2024-05-09 LAB — URINALYSIS, ROUTINE W REFLEX MICROSCOPIC
Bilirubin Urine: NEGATIVE
Glucose, UA: NEGATIVE mg/dL
Hgb urine dipstick: NEGATIVE
Ketones, ur: NEGATIVE mg/dL
Leukocytes,Ua: NEGATIVE
Nitrite: NEGATIVE
Protein, ur: NEGATIVE mg/dL
Specific Gravity, Urine: 1.024 (ref 1.005–1.030)
pH: 5 (ref 5.0–8.0)

## 2024-05-09 LAB — TYPE AND SCREEN
ABO/RH(D): O POS
Antibody Screen: NEGATIVE

## 2024-05-09 NOTE — Patient Instructions (Addendum)
 Your procedure is scheduled on: Thursday 05/19/24 Report to the Registration Desk on the 1st floor of the [facility]. To find out your arrival time, please call [phone] between 1PM - 3PM on: Wednesday 05/18/24 If your arrival time is 6:00 am, do not arrive before that time as the [facility] entrance doors do not open until 6:00 am.  REMEMBER: Instructions that are not followed completely may result in serious medical risk, up to and including death; or upon the discretion of your surgeon and anesthesiologist your surgery may need to be rescheduled.  Do not eat food after midnight the night before surgery.  No gum chewing or hard candies.  You may however, drink CLEAR liquids up to 2 hours before you are scheduled to arrive for your surgery. Do not drink anything within 2 hours of your scheduled arrival time.  Clear liquids include: - water   - apple juice without pulp - gatorade (not RED colors) - black coffee or tea (Do NOT add milk or creamers to the coffee or tea) Do NOT drink anything that is not on this list.  In addition, your doctor has ordered for you to drink the provided:  Ensure Pre-Surgery Clear Carbohydrate Drink  Drinking this carbohydrate drink up to two hours before surgery helps to reduce insulin resistance and improve patient outcomes. Please complete drinking 2 hours before scheduled arrival time.  One week prior to surgery: Stop Anti-inflammatories (NSAIDS) such as Advil, Aleve, Ibuprofen, Motrin, Naproxen, Naprosyn and Aspirin based products such as Excedrin, Goody's Powder, BC Powder. You may however, continue to take Tylenol if needed for pain up until the day of surgery  Stop ANY OVER THE COUNTER supplements and vitamins until after surgery.  Continue taking all of your other prescription medications up until the day of surgery.  ON THE DAY OF SURGERY ONLY TAKE THESE MEDICATIONS WITH SIPS OF WATER :  rosuvastatin  (CRESTOR ) 40 MG table   No Alcohol for  24 hours before or after surgery.  No Smoking including e-cigarettes for 24 hours before surgery.  No chewable tobacco products for at least 6 hours before surgery.  No nicotine patches on the day of surgery.  Do not use any recreational drugs for at least a week (preferably 2 weeks) before your surgery.  Please be advised that the combination of cocaine and anesthesia may have negative outcomes, up to and including death. If you test positive for cocaine, your surgery will be cancelled.  On the morning of surgery brush your teeth with toothpaste and water , you may rinse your mouth with mouthwash if you wish. Do not swallow any toothpaste or mouthwash.  Use CHG Soap or wipes as directed on instruction sheet.  Do not wear jewelry, make-up, hairpins, clips or nail polish.  For welded (permanent) jewelry: bracelets, anklets, waist bands, etc.  Please have this removed prior to surgery.  If it is not removed, there is a chance that hospital personnel will need to cut it off on the day of surgery.  Do not wear lotions, powders, or perfumes.   Do not shave body hair from the neck down 48 hours before surgery.  Contact lenses, hearing aids and dentures may not be worn into surgery. Bring a case for your glasses  Do not bring valuables to the hospital. [facility] is not responsible for any missing/lost belongings or valuables.   Notify your doctor if there is any change in your medical condition (cold, fever, infection).  Wear comfortable clothing (specific to  your surgery type) to the hospital.  After surgery, you can help prevent lung complications by doing breathing exercises.  Take deep breaths and cough every 1-2 hours. Your doctor may order a device called an Incentive Spirometer to help you take deep breaths.  If you are being admitted to the hospital overnight, leave your suitcase in the car. After surgery it may be brought to your room.  In case of increased patient census, it  may be necessary for you, the patient, to continue your postoperative care in the Same Day Surgery department.  If you are being discharged the day of surgery, you will not be allowed to drive home. You will need a responsible individual to drive you home and stay with you for 24 hours after surgery.   If you are taking public transportation, you will need to have a responsible individual with you.  Please call the Pre-admissions Testing Dept. at [phone] if you have any questions about these instructions.  Surgery Visitation Policy:  Patients having surgery or a procedure may have two visitors.  Children under the age of 50 must have an adult with them who is not the patient.  Inpatient Visitation:    Visiting hours are 7 a.m. to 8 p.m. Up to four visitors are allowed at one time in a patient room. The visitors may rotate out with other people during the day.  One visitor age 59 or older may stay with the patient overnight and must be in the room by 8 p.m.   Merchandiser, retail to address health-related social needs:  [email or web address]    Pre-operative 5 CHG Bath Instructions   You can play a key role in reducing the risk of infection after surgery. Your skin needs to be as free of germs as possible. You can reduce the number of germs on your skin by washing with CHG (chlorhexidine gluconate) soap before surgery. CHG is an antiseptic soap that kills germs and continues to kill germs even after washing.   DO NOT use if you have an allergy to chlorhexidine/CHG or antibacterial soaps. If your skin becomes reddened or irritated, stop using the CHG and notify one of our RNs at [phone].   Please shower with the CHG soap starting 4 days before surgery using the following schedule:   Sunday 05/15/24 - Thursday 05/19/24    Please keep in mind the following:  DO NOT shave, including legs and underarms, starting the day of your first shower.   You may  shave your face at any point before/day of surgery.  Place clean sheets on your bed the day you start using CHG soap. Use a clean washcloth (not used since being washed) for each shower. DO NOT sleep with pets once you start using the CHG.   CHG Shower Instructions:  If you choose to wash your hair and private area, wash first with your normal shampoo/soap.  After you use shampoo/soap, rinse your hair and body thoroughly to remove shampoo/soap residue.  Turn the water  OFF and apply about 3 tablespoons (45 ml) of CHG soap to a CLEAN washcloth.  Apply CHG soap ONLY FROM YOUR NECK DOWN TO YOUR TOES (washing for 3-5 minutes)  DO NOT use CHG soap on face, private areas, open wounds, or sores.  Pay special attention to the area where your surgery is being performed.  If you are having back surgery, having someone wash your back for you may be helpful. Wait 2 minutes after CHG soap is  applied, then you may rinse off the CHG soap.  Pat dry with a clean towel  Put on clean clothes/pajamas   If you choose to wear lotion, please use ONLY the CHG-compatible lotions on the back of this paper.     Additional instructions for the day of surgery: DO NOT APPLY any lotions, deodorants, cologne, or perfumes.   Put on clean/comfortable clothes.  Brush your teeth.  Ask your nurse before applying any prescription medications to the skin.      CHG Compatible Lotions   Aveeno Moisturizing lotion  Cetaphil Moisturizing Cream  Cetaphil Moisturizing Lotion  Clairol Herbal Essence Moisturizing Lotion, Dry Skin  Clairol Herbal Essence Moisturizing Lotion, Extra Dry Skin  Clairol Herbal Essence Moisturizing Lotion, Normal Skin  Curel Age Defying Therapeutic Moisturizing Lotion with Alpha Hydroxy  Curel Extreme Care Body Lotion  Curel Soothing Hands Moisturizing Hand Lotion  Curel Therapeutic Moisturizing Cream, Fragrance-Free  Curel Therapeutic Moisturizing Lotion, Fragrance-Free  Curel Therapeutic  Moisturizing Lotion, Original Formula  Eucerin Daily Replenishing Lotion  Eucerin Dry Skin Therapy Plus Alpha Hydroxy Crme  Eucerin Dry Skin Therapy Plus Alpha Hydroxy Lotion  Eucerin Original Crme  Eucerin Original Lotion  Eucerin Plus Crme Eucerin Plus Lotion  Eucerin TriLipid Replenishing Lotion  Keri Anti-Bacterial Hand Lotion  Keri Deep Conditioning Original Lotion Dry Skin Formula Softly Scented  Keri Deep Conditioning Original Lotion, Fragrance Free Sensitive Skin Formula  Keri Lotion Fast Absorbing Fragrance Free Sensitive Skin Formula  Keri Lotion Fast Absorbing Softly Scented Dry Skin Formula  Keri Original Lotion  Keri Skin Renewal Lotion Keri Silky Smooth Lotion  Keri Silky Smooth Sensitive Skin Lotion  Nivea Body Creamy Conditioning Oil  Nivea Body Extra Enriched Lotion  Nivea Body Original Lotion  Nivea Body Sheer Moisturizing Lotion Nivea Crme  Nivea Skin Firming Lotion  NutraDerm 30 Skin Lotion  NutraDerm Skin Lotion  NutraDerm Therapeutic Skin Cream  NutraDerm Therapeutic Skin Lotion  ProShield Protective Hand Cream  Provon moisturizing lotion  How to Use an Incentive Spirometer  An incentive spirometer is a tool that measures how well you are filling your lungs with each breath. Learning to take [place], deep breaths using this tool can help you keep your lungs clear and active. This may help to reverse or lessen your chance of developing breathing (pulmonary) problems, especially infection. You may be asked to use a spirometer: After a surgery. If you have a lung problem or a history of smoking. After a Milburn period of time when you have been unable to move or be active. If the spirometer includes an indicator to show the highest number that you have reached, your health care provider or respiratory therapist will help you set a goal. Keep a log of your progress as told by your health care provider. What are the risks? Breathing too quickly may cause dizziness  or cause you to pass out. Take your time so you do not get dizzy or light-headed. If you are in pain, you may need to take pain medicine before doing incentive spirometry. It is harder to take a deep breath if you are having pain. How to use your incentive spirometer  Sit up on the edge of your bed or on a chair. Hold the incentive spirometer so that it is in an upright position. Before you use the spirometer, breathe out normally. Place the mouthpiece in your mouth. Make sure your lips are closed tightly around it. Breathe in slowly and as deeply as you can through  your mouth, causing the piston or the ball to rise toward the top of the chamber. Hold your breath for 3-5 seconds, or for as [place] as possible. If the spirometer includes a coach indicator, use this to guide you in breathing. Slow down your breathing if the indicator goes above the marked areas. Remove the mouthpiece from your mouth and breathe out normally. The piston or ball will return to the bottom of the chamber. Rest for a few seconds, then repeat the steps 10 or more times. Take your time and take a few normal breaths between deep breaths so that you do not get dizzy or light-headed. Do this every 1-2 hours when you are awake. If the spirometer includes a goal marker to show the highest number you have reached (best effort), use this as a goal to work toward during each repetition. After each set of 10 deep breaths, cough a few times. This will help to make sure that your lungs are clear. If you have an incision on your chest or abdomen from surgery, place a pillow or a rolled-up towel firmly against the incision when you cough. This can help to reduce pain while taking deep breaths and coughing. General tips When you are able to get out of bed: Walk around often. Continue to take deep breaths and cough in order to clear your lungs. Keep using the incentive spirometer until your health care provider says it is okay to stop  using it. If you have been in the hospital, you may be told to keep using the spirometer at home. Contact a health care provider if: You are having difficulty using the spirometer. You have trouble using the spirometer as often as instructed. Your pain medicine is not giving enough relief for you to use the spirometer as told. You have a fever. Get help right away if: You develop shortness of breath. You develop a cough with bloody mucus from the lungs. You have fluid or blood coming from an incision site after you cough. Summary An incentive spirometer is a tool that can help you learn to take Jonsson, deep breaths to keep your lungs clear and active. You may be asked to use a spirometer after a surgery, if you have a lung problem or a history of smoking, or if you have been inactive for a Klare period of time. Use your incentive spirometer as instructed every 1-2 hours while you are awake. If you have an incision on your chest or abdomen, place a pillow or a rolled-up towel firmly against your incision when you cough. This will help to reduce pain. Get help right away if you have shortness of breath, you cough up bloody mucus, or blood comes from your incision when you cough. This information is not intended to replace advice given to you by your health care provider. Make sure you discuss any questions you have with your health care provider. Document Revised: 01/09/2020 Document Reviewed: 01/09/2020 Elsevier Patient Education  2023 Elsevier Inc.    Preoperative Educational Videos for Total Hip, Knee and Shoulder Replacements  To better prepare for surgery, please view our videos that explain the physical activity and discharge planning required to have the best surgical recovery at [facility].  [email or web address]  Questions? Call [phone] or email [email or web address]

## 2024-05-09 NOTE — Progress Notes (Signed)
 Cardiac Clearance in Epic from Dr Darliss from 6-27

## 2024-05-10 ENCOUNTER — Encounter: Payer: Self-pay | Admitting: Urgent Care

## 2024-05-10 ENCOUNTER — Encounter
Admission: RE | Admit: 2024-05-10 | Discharge: 2024-05-10 | Disposition: A | Discharge status: Home / Self Care | Source: Ambulatory Visit | Attending: Orthopedic Surgery | Admitting: Orthopedic Surgery

## 2024-05-10 DIAGNOSIS — Z01812 Encounter for preprocedural laboratory examination: Secondary | ICD-10-CM | POA: Insufficient documentation

## 2024-05-10 DIAGNOSIS — Z01818 Encounter for other preprocedural examination: Secondary | ICD-10-CM | POA: Diagnosis present

## 2024-05-10 LAB — SURGICAL PCR SCREEN
MRSA, PCR: NEGATIVE
Staphylococcus aureus: POSITIVE — AB

## 2024-05-19 ENCOUNTER — Ambulatory Visit

## 2024-05-19 ENCOUNTER — Ambulatory Visit: Admitting: Certified Registered"

## 2024-05-19 ENCOUNTER — Ambulatory Visit: Payer: Self-pay | Admitting: Urgent Care

## 2024-05-19 ENCOUNTER — Other Ambulatory Visit: Payer: Self-pay

## 2024-05-19 ENCOUNTER — Ambulatory Visit
Admission: RE | Admit: 2024-05-19 | Discharge: 2024-05-20 | Disposition: A | Discharge status: Home Health | Attending: Orthopedic Surgery | Admitting: Orthopedic Surgery

## 2024-05-19 ENCOUNTER — Encounter: Payer: Self-pay | Admitting: Orthopedic Surgery

## 2024-05-19 ENCOUNTER — Encounter
Admission: RE | Disposition: A | Discharge status: Home Health | Payer: Self-pay | Source: Home / Self Care | Attending: Orthopedic Surgery

## 2024-05-19 DIAGNOSIS — M1611 Unilateral primary osteoarthritis, right hip: Secondary | ICD-10-CM | POA: Insufficient documentation

## 2024-05-19 DIAGNOSIS — Z96641 Presence of right artificial hip joint: Secondary | ICD-10-CM | POA: Diagnosis present

## 2024-05-19 HISTORY — PX: TOTAL HIP ARTHROPLASTY: SHX124

## 2024-05-19 LAB — ABO/RH: ABO/RH(D): O POS

## 2024-05-19 SURGERY — ARTHROPLASTY, HIP, TOTAL, ANTERIOR APPROACH
Anesthesia: Spinal | Site: Hip | Laterality: Right

## 2024-05-19 MED ORDER — BUPIVACAINE LIPOSOME 1.3 % IJ SUSP
INTRAMUSCULAR | Status: AC
  Filled 2024-05-19: qty 40

## 2024-05-19 MED ORDER — MUPIROCIN 2 % EX OINT: 1.0000 | TOPICAL_OINTMENT | Freq: Two times a day (BID) | CUTANEOUS | 0 refills | Status: AC

## 2024-05-19 MED ORDER — PHENYLEPHRINE HCL-NACL 20-0.9 MG/250ML-% IV SOLN
INTRAVENOUS | Status: AC
  Filled 2024-05-19: qty 250

## 2024-05-19 MED ORDER — ACETAMINOPHEN 10 MG/ML IV SOLN
INTRAVENOUS | Status: AC
  Filled 2024-05-19: qty 100

## 2024-05-19 MED ORDER — MORPHINE SULFATE (PF) 4 MG/ML IV SOLN: 0.5000 mg | INTRAVENOUS | Status: DC | PRN

## 2024-05-19 MED ORDER — SODIUM CHLORIDE 0.9 % IV SOLN: INTRAVENOUS | Status: DC

## 2024-05-19 MED ORDER — PHENYLEPHRINE HCL-NACL 20-0.9 MG/250ML-% IV SOLN
INTRAVENOUS | Status: DC | PRN
  Administered 2024-05-19: 30 ug/min via INTRAVENOUS

## 2024-05-19 MED ORDER — CEFAZOLIN SODIUM-DEXTROSE 2-4 GM/100ML-% IV SOLN
INTRAVENOUS | Status: AC
  Filled 2024-05-19: qty 100

## 2024-05-19 MED ORDER — METOCLOPRAMIDE HCL 5 MG/ML IJ SOLN: 5.0000 mg | Freq: Three times a day (TID) | INTRAMUSCULAR | Status: DC | PRN

## 2024-05-19 MED ORDER — DEXAMETHASONE SODIUM PHOSPHATE 10 MG/ML IJ SOLN
8.0000 mg | Freq: Once | INTRAMUSCULAR | Status: AC
  Administered 2024-05-19: 10 mg via INTRAVENOUS

## 2024-05-19 MED ORDER — MIDAZOLAM HCL 5 MG/5ML IJ SOLN
INTRAMUSCULAR | Status: DC | PRN
  Administered 2024-05-19: 2 mg via INTRAVENOUS

## 2024-05-19 MED ORDER — KETOROLAC TROMETHAMINE 15 MG/ML IJ SOLN
7.5000 mg | Freq: Four times a day (QID) | INTRAMUSCULAR | Status: AC
  Administered 2024-05-19 – 2024-05-20 (×4): 7.5 mg via INTRAVENOUS
  Filled 2024-05-19 (×3): qty 1

## 2024-05-19 MED ORDER — FENTANYL CITRATE (PF) 100 MCG/2ML IJ SOLN
INTRAMUSCULAR | Status: DC | PRN
  Administered 2024-05-19: 50 ug via INTRAVENOUS

## 2024-05-19 MED ORDER — KETOROLAC TROMETHAMINE 15 MG/ML IJ SOLN
INTRAMUSCULAR | Status: AC
Start: 2024-05-19 — End: 2024-05-19
  Filled 2024-05-19: qty 1

## 2024-05-19 MED ORDER — BUPIVACAINE-EPINEPHRINE (PF) 0.25% -1:200000 IJ SOLN
INTRAMUSCULAR | Status: AC
  Filled 2024-05-19: qty 60

## 2024-05-19 MED ORDER — PANTOPRAZOLE SODIUM 40 MG PO TBEC
40.0000 mg | DELAYED_RELEASE_TABLET | Freq: Every day | ORAL | Status: DC
  Administered 2024-05-19 – 2024-05-20 (×2): 40 mg via ORAL
  Filled 2024-05-19 (×2): qty 1

## 2024-05-19 MED ORDER — TRANEXAMIC ACID-NACL 1000-0.7 MG/100ML-% IV SOLN
INTRAVENOUS | Status: AC
  Filled 2024-05-19: qty 100

## 2024-05-19 MED ORDER — SODIUM CHLORIDE (PF) 0.9 % IJ SOLN
INTRAMUSCULAR | Status: AC
  Filled 2024-05-19: qty 10

## 2024-05-19 MED ORDER — ACETAMINOPHEN 500 MG PO TABS
1000.0000 mg | ORAL_TABLET | Freq: Three times a day (TID) | ORAL | Status: DC
  Administered 2024-05-19 – 2024-05-20 (×2): 1000 mg via ORAL
  Filled 2024-05-19 (×2): qty 2

## 2024-05-19 MED ORDER — PHENOL 1.4 % MT LIQD: 1.0000 | OROMUCOSAL | Status: DC | PRN

## 2024-05-19 MED ORDER — ASPIRIN 81 MG PO CHEW
81.0000 mg | CHEWABLE_TABLET | Freq: Two times a day (BID) | ORAL | Status: DC
  Administered 2024-05-19 – 2024-05-20 (×2): 81 mg via ORAL
  Filled 2024-05-19 (×2): qty 1

## 2024-05-19 MED ORDER — VASOPRESSIN 20 UNIT/ML IV SOLN
INTRAVENOUS | Status: DC | PRN
Start: 2024-05-19 — End: 2024-05-19
  Administered 2024-05-19: 2 [IU] via INTRAVENOUS
  Administered 2024-05-19: 1 [IU] via INTRAVENOUS

## 2024-05-19 MED ORDER — ORAL CARE MOUTH RINSE: 15.0000 mL | Freq: Once | OROMUCOSAL | Status: AC

## 2024-05-19 MED ORDER — SODIUM CHLORIDE (PF) 0.9 % IJ SOLN
INTRAMUSCULAR | Status: AC
  Filled 2024-05-19: qty 20

## 2024-05-19 MED ORDER — ROSUVASTATIN CALCIUM 10 MG PO TABS: 40.0000 mg | ORAL_TABLET | Freq: Every day | ORAL | Status: DC

## 2024-05-19 MED ORDER — PROPOFOL 500 MG/50ML IV EMUL
INTRAVENOUS | Status: DC | PRN
  Administered 2024-05-19: 100 ug/kg/min via INTRAVENOUS

## 2024-05-19 MED ORDER — METOCLOPRAMIDE HCL 5 MG PO TABS
5.0000 mg | ORAL_TABLET | Freq: Three times a day (TID) | ORAL | Status: DC | PRN
  Filled 2024-05-19: qty 2

## 2024-05-19 MED ORDER — CEFAZOLIN SODIUM-DEXTROSE 2-4 GM/100ML-% IV SOLN
2.0000 g | Freq: Four times a day (QID) | INTRAVENOUS | Status: AC
  Administered 2024-05-19 (×2): 2 g via INTRAVENOUS
  Filled 2024-05-19 (×3): qty 100

## 2024-05-19 MED ORDER — CEFAZOLIN SODIUM-DEXTROSE 2-4 GM/100ML-% IV SOLN
INTRAVENOUS | Status: AC
Start: 2024-05-19 — End: 2024-05-19
  Filled 2024-05-19: qty 100

## 2024-05-19 MED ORDER — LACTATED RINGERS IV SOLN: INTRAVENOUS | Status: DC

## 2024-05-19 MED ORDER — SODIUM CHLORIDE (PF) 0.9 % IJ SOLN
INTRAMUSCULAR | Status: DC | PRN
  Administered 2024-05-19: 50 mL via INTRAMUSCULAR

## 2024-05-19 MED ORDER — DOCUSATE SODIUM 100 MG PO CAPS
ORAL_CAPSULE | ORAL | Status: AC
  Filled 2024-05-19: qty 1

## 2024-05-19 MED ORDER — MENTHOL 3 MG MT LOZG: 1.0000 | LOZENGE | OROMUCOSAL | Status: DC | PRN

## 2024-05-19 MED ORDER — 0.9 % SODIUM CHLORIDE (POUR BTL) OPTIME
TOPICAL | Status: DC | PRN
  Administered 2024-05-19: 500 mL

## 2024-05-19 MED ORDER — ACETAMINOPHEN 10 MG/ML IV SOLN
INTRAVENOUS | Status: DC | PRN
  Administered 2024-05-19: 1000 mg via INTRAVENOUS

## 2024-05-19 MED ORDER — CHLORHEXIDINE GLUCONATE 0.12 % MT SOLN
15.0000 mL | Freq: Once | OROMUCOSAL | Status: AC
  Administered 2024-05-19: 15 mL via OROMUCOSAL

## 2024-05-19 MED ORDER — TRAMADOL HCL 50 MG PO TABS
ORAL_TABLET | ORAL | Status: AC
  Filled 2024-05-19: qty 1

## 2024-05-19 MED ORDER — CHLORHEXIDINE GLUCONATE 4 % EX SOLN: 1.0000 | CUTANEOUS | 1 refills | Status: DC

## 2024-05-19 MED ORDER — HYDROCODONE-ACETAMINOPHEN 5-325 MG PO TABS
1.0000 | ORAL_TABLET | ORAL | Status: DC | PRN
  Administered 2024-05-19 – 2024-05-20 (×2): 1 via ORAL
  Filled 2024-05-19 (×2): qty 1

## 2024-05-19 MED ORDER — ACETAMINOPHEN 325 MG PO TABS: 325.0000 mg | ORAL_TABLET | Freq: Four times a day (QID) | ORAL | Status: DC | PRN

## 2024-05-19 MED ORDER — ONDANSETRON HCL 4 MG PO TABS
4.0000 mg | ORAL_TABLET | Freq: Four times a day (QID) | ORAL | Status: DC | PRN
  Filled 2024-05-19: qty 1

## 2024-05-19 MED ORDER — BUPIVACAINE HCL (PF) 0.5 % IJ SOLN
INTRAMUSCULAR | Status: DC | PRN
  Administered 2024-05-19: 2.8 mL

## 2024-05-19 MED ORDER — SODIUM CHLORIDE 0.9 % IR SOLN
Status: DC | PRN
Start: 2024-05-19 — End: 2024-05-19
  Administered 2024-05-19: 100 mL

## 2024-05-19 MED ORDER — MIDAZOLAM HCL 2 MG/2ML IJ SOLN
INTRAMUSCULAR | Status: AC
  Filled 2024-05-19: qty 2

## 2024-05-19 MED ORDER — CHLORHEXIDINE GLUCONATE 0.12 % MT SOLN
OROMUCOSAL | Status: AC
  Filled 2024-05-19: qty 15

## 2024-05-19 MED ORDER — SURGIPHOR WOUND IRRIGATION SYSTEM - OPTIME: TOPICAL | Status: DC | PRN

## 2024-05-19 MED ORDER — ONDANSETRON HCL 4 MG/2ML IJ SOLN
INTRAMUSCULAR | Status: DC | PRN
  Administered 2024-05-19: 4 mg via INTRAVENOUS

## 2024-05-19 MED ORDER — TRANEXAMIC ACID-NACL 1000-0.7 MG/100ML-% IV SOLN
1000.0000 mg | INTRAVENOUS | Status: AC
  Administered 2024-05-19 (×2): 1000 mg via INTRAVENOUS

## 2024-05-19 MED ORDER — GLYCOPYRROLATE 0.2 MG/ML IJ SOLN
INTRAMUSCULAR | Status: DC | PRN
  Administered 2024-05-19: .2 mg via INTRAVENOUS

## 2024-05-19 MED ORDER — DOCUSATE SODIUM 100 MG PO CAPS
100.0000 mg | ORAL_CAPSULE | Freq: Two times a day (BID) | ORAL | Status: DC
  Administered 2024-05-19 – 2024-05-20 (×3): 100 mg via ORAL
  Filled 2024-05-19 (×2): qty 1

## 2024-05-19 MED ORDER — PHENYLEPHRINE 80 MCG/ML (10ML) SYRINGE FOR IV PUSH (FOR BLOOD PRESSURE SUPPORT)
PREFILLED_SYRINGE | INTRAVENOUS | Status: DC | PRN
  Administered 2024-05-19: 160 ug via INTRAVENOUS
  Administered 2024-05-19: 80 ug via INTRAVENOUS
  Administered 2024-05-19: 160 ug via INTRAVENOUS
  Administered 2024-05-19 (×3): 80 ug via INTRAVENOUS

## 2024-05-19 MED ORDER — CEFAZOLIN SODIUM-DEXTROSE 2-4 GM/100ML-% IV SOLN
2.0000 g | INTRAVENOUS | Status: AC
  Administered 2024-05-19: 2 g via INTRAVENOUS

## 2024-05-19 MED ORDER — ONDANSETRON HCL 4 MG/2ML IJ SOLN: 4.0000 mg | Freq: Four times a day (QID) | INTRAMUSCULAR | Status: DC | PRN

## 2024-05-19 MED ORDER — PROPOFOL 1000 MG/100ML IV EMUL
INTRAVENOUS | Status: AC
Start: 2024-05-19 — End: 2024-05-19
  Filled 2024-05-19: qty 100

## 2024-05-19 MED ORDER — FENTANYL CITRATE (PF) 100 MCG/2ML IJ SOLN
INTRAMUSCULAR | Status: AC
Start: 2024-05-19 — End: 2024-05-19
  Filled 2024-05-19: qty 2

## 2024-05-19 MED ORDER — TRAMADOL HCL 50 MG PO TABS
50.0000 mg | ORAL_TABLET | Freq: Four times a day (QID) | ORAL | Status: DC | PRN
  Administered 2024-05-19 (×2): 50 mg via ORAL
  Filled 2024-05-19: qty 1

## 2024-05-19 MED ORDER — FENTANYL CITRATE (PF) 100 MCG/2ML IJ SOLN: 25.0000 ug | INTRAMUSCULAR | Status: DC | PRN

## 2024-05-19 SURGICAL SUPPLY — 57 items
BLADE CLIPPER SURG (BLADE) IMPLANT
BLADE SAGITTAL AGGR TOOTH XLG (BLADE) ×1 IMPLANT
BNDG COHESIVE 6X5 TAN ST LF (GAUZE/BANDAGES/DRESSINGS) ×2 IMPLANT
BRUSH SCRUB EZ PLAIN DRY (MISCELLANEOUS) ×1 IMPLANT
CHLORAPREP W/TINT 26 (MISCELLANEOUS) ×1 IMPLANT
DERMABOND ADVANCED .7 DNX12 (GAUZE/BANDAGES/DRESSINGS) ×1 IMPLANT
DRAPE C-ARM XRAY 36X54 (DRAPES) ×1 IMPLANT
DRAPE SHEET LG 3/4 BI-LAMINATE (DRAPES) ×2 IMPLANT
DRAPE TABLE BACK 80X90 (DRAPES) ×1 IMPLANT
DRSG MEPILEX SACRM 8.7X9.8 (GAUZE/BANDAGES/DRESSINGS) ×1 IMPLANT
DRSG OPSITE POSTOP 4X8 (GAUZE/BANDAGES/DRESSINGS) ×1 IMPLANT
ELECTRODE BLDE 4.0 EZ CLN MEGD (MISCELLANEOUS) ×1 IMPLANT
ELECTRODE REM PT RTRN 9FT ADLT (ELECTROSURGICAL) ×1 IMPLANT
GLOVE BIO SURGEON STRL SZ8 (GLOVE) ×1 IMPLANT
GLOVE BIOGEL PI IND STRL 8 (GLOVE) ×1 IMPLANT
GLOVE PI ORTHO PRO STRL 7.5 (GLOVE) ×2 IMPLANT
GLOVE PI ORTHO PRO STRL SZ8 (GLOVE) ×2 IMPLANT
GLOVE SURG SYN 7.5 PF PI (GLOVE) ×1 IMPLANT
GOWN SRG XL LVL 3 NONREINFORCE (GOWNS) ×1 IMPLANT
GOWN STRL REUS W/ TWL LRG LVL3 (GOWN DISPOSABLE) ×1 IMPLANT
GOWN STRL REUS W/ TWL XL LVL3 (GOWN DISPOSABLE) ×1 IMPLANT
HEAD CERAMIC FEMORAL 36MM (Head) IMPLANT
HOOD PEEL AWAY T7 (MISCELLANEOUS) ×2 IMPLANT
INSERT TRIDENT POLY 36 0DEG (Insert) IMPLANT
IV NS 100ML SINGLE PACK (IV SOLUTION) ×1 IMPLANT
KIT PATIENT CARE HANA TABLE (KITS) ×1 IMPLANT
KIT TURNOVER CYSTO (KITS) ×1 IMPLANT
LIGHT WAVEGUIDE WIDE FLAT (MISCELLANEOUS) ×1 IMPLANT
MANIFOLD NEPTUNE II (INSTRUMENTS) ×1 IMPLANT
MARKER SKIN DUAL TIP RULER LAB (MISCELLANEOUS) ×1 IMPLANT
MAT ABSORB FLUID 56X50 GRAY (MISCELLANEOUS) ×1 IMPLANT
NDL SPNL 20GX3.5 QUINCKE YW (NEEDLE) ×1 IMPLANT
NEEDLE SPNL 20GX3.5 QUINCKE YW (NEEDLE) ×1 IMPLANT
NS IRRIG 500ML POUR BTL (IV SOLUTION) ×1 IMPLANT
PACK HIP COMPR (MISCELLANEOUS) ×1 IMPLANT
PAD ARMBOARD POSITIONER FOAM (MISCELLANEOUS) ×1 IMPLANT
PENCIL SMOKE EVACUATOR (MISCELLANEOUS) ×1 IMPLANT
SCREW HEX LP 6.5X20 (Screw) IMPLANT
SCREW HEX LP 6.5X25 (Screw) IMPLANT
SHELL CLUSTERHOLE ACETABULAR 5 (Shell) IMPLANT
SLEEVE SCD COMPRESS KNEE MED (STOCKING) ×1 IMPLANT
SOLUTION IRRIG SURGIPHOR (IV SOLUTION) ×1 IMPLANT
STEM HIGH OFFSET SZ3 36X101 (Stem) IMPLANT
SURGIFLO W/THROMBIN 8M KIT (HEMOSTASIS) IMPLANT
SUT BONE WAX W31G (SUTURE) ×1 IMPLANT
SUT ETHIBOND 2 V 37 (SUTURE) ×1 IMPLANT
SUT SILK 0 30XBRD TIE 6 (SUTURE) ×1 IMPLANT
SUT STRATAFIX 14 PDO 36 VLT (SUTURE) ×1 IMPLANT
SUT VIC AB 0 CT1 36 (SUTURE) ×1 IMPLANT
SUT VIC AB 2-0 CT2 27 (SUTURE) ×1 IMPLANT
SUTURE STRATA SPIR 4-0 18 (SUTURE) ×1 IMPLANT
SYR 20ML LL LF (SYRINGE) ×2 IMPLANT
TAPE MICROFOAM 4IN (TAPE) IMPLANT
TOWEL OR 17X26 4PK STRL BLUE (TOWEL DISPOSABLE) IMPLANT
TRAP FLUID SMOKE EVACUATOR (MISCELLANEOUS) ×1 IMPLANT
WAND WEREWOLF FASTSEAL 6.0 (MISCELLANEOUS) ×1 IMPLANT
WATER STERILE IRR 1000ML POUR (IV SOLUTION) ×1 IMPLANT

## 2024-05-19 NOTE — Plan of Care (Signed)
   Problem: Activity: Goal: Ability to avoid complications of mobility impairment will improve Outcome: Progressing   Problem: Pain Management: Goal: Pain level will decrease with appropriate interventions Outcome: Progressing

## 2024-05-19 NOTE — Evaluation (Signed)
 Physical Therapy Evaluation Patient Details Name: Alex Peterson MRN: [id number] DOB: Mar 19, 1971 Today's Date: 05/19/2024  History of Present Illness  Pt admitted for R THR and is POD 0 at time of evaluation. No PMH  Clinical Impression  Pt is a pleasant 53 year old male who was admitted for R THR and is POD 0. Pt performs transfers with mod I and ambulation with supervision and RW. Pt demonstrates deficits with strength/mobility. Pt is very eager to go home this date. Will need DME prior to home discharge, RN notified. Would benefit from skilled PT to address above deficits and promote optimal return to PLOF. Pt will continue to receive skilled PT services while admitted and will defer to TOC/care team for updates regarding disposition planning.         If plan is discharge home, recommend the following: A little help with walking and/or transfers   Can travel by private vehicle        Equipment Recommendations Rolling walker (2 wheels);BSC/3in1  Recommendations for Other Services       Functional Status Assessment Patient has had a recent decline in their functional status and demonstrates the ability to make significant improvements in function in a reasonable and predictable amount of time.     Precautions / Restrictions Precautions Precautions: Fall;Anterior Hip Precaution Booklet Issued: No Restrictions Weight Bearing Restrictions Per Provider Order: Yes RLE Weight Bearing Per Provider Order: Weight bearing as tolerated      Mobility  Bed Mobility               General bed mobility comments: NT, received in recliner    Transfers Overall transfer level: Modified independent Equipment used: Rolling walker (2 wheels)               General transfer comment: safe technique with ease of mobility    Ambulation/Gait Ambulation/Gait assistance: Supervision Gait Distance (Feet): 140 Feet Assistive device: Rolling walker (2 wheels) Gait Pattern/deviations:  Step-through pattern       General Gait Details: ambulated in hallway with RW. Reports he feels R LE is higher than L LE. Slight tremor noted. No dizziness  Stairs            Wheelchair Mobility     Tilt Bed    Modified Rankin (Stroke Patients Only)       Balance Overall balance assessment: Independent                                           Pertinent Vitals/Pain Pain Assessment Pain Assessment: 0-10 Pain Score: 2  Pain Location: R hip Pain Descriptors / Indicators: Operative site guarding Pain Intervention(s): Limited activity within patient's tolerance, Repositioned, Ice applied    Home Living Family/patient expects to be discharged to:: Private residence Living Arrangements: Spouse/significant other Available Help at Discharge: Family Type of Home: House Home Access: Stairs to enter Entrance Stairs-Rails: Left Entrance Stairs-Number of Steps: 2   Home Layout: One level Home Equipment: None      Prior Function Prior Level of Function : Independent/Modified Independent             Mobility Comments: indep ADLs Comments: indep     Extremity/Trunk Assessment   Upper Extremity Assessment Upper Extremity Assessment: Overall WFL for tasks assessed    Lower Extremity Assessment Lower Extremity Assessment: Generalized weakness (R LE grossly 4/5)  Communication   Communication Communication: No apparent difficulties    Cognition Arousal: Alert Behavior During Therapy: WFL for tasks assessed/performed   PT - Cognitive impairments: No apparent impairments                       PT - Cognition Comments: pleasant and agreeable to session Following commands: Intact       Cueing Cueing Techniques: Verbal cues     General Comments      Exercises     Assessment/Plan    PT Assessment Patient needs continued PT services  PT Problem List Decreased strength;Decreased balance;Decreased mobility;Pain        PT Treatment Interventions DME instruction;Gait training;Stair training;Therapeutic exercise;Balance training    PT Goals (Current goals can be found in the Care Plan section)  Acute Rehab PT Goals Patient Stated Goal: to go home PT Goal Formulation: With patient Time For Goal Achievement: 06/02/24 Potential to Achieve Goals: Good    Frequency BID     Co-evaluation               AM-PAC PT 6 Clicks Mobility  Outcome Measure Help needed turning from your back to your side while in a flat bed without using bedrails?: None Help needed moving from lying on your back to sitting on the side of a flat bed without using bedrails?: None Help needed moving to and from a bed to a chair (including a wheelchair)?: A Little Help needed standing up from a chair using your arms (e.g., wheelchair or bedside chair)?: A Little Help needed to walk in hospital room?: A Little Help needed climbing 3-5 steps with a railing? : A Little 6 Click Score: 20    End of Session   Activity Tolerance: Patient tolerated treatment well Patient left: in chair;with family/visitor present Nurse Communication: Mobility status PT Visit Diagnosis: Muscle weakness (generalized) (M62.81);Difficulty in walking, not elsewhere classified (R26.2);Pain Pain - Right/Left: Right Pain - part of body: Hip    Time: 8381-8365 PT Time Calculation (min) (ACUTE ONLY): 16 min   Charges:   PT Evaluation $PT Eval Low Complexity: 1 Low PT Treatments $Gait Training: 8-22 mins PT General Charges $$ ACUTE PT VISIT: 1 Visit         Alex Peterson, PT, DPT, GCS [phone]   Alex Peterson 05/19/2024, 4:50 PM

## 2024-05-19 NOTE — Transfer of Care (Signed)
 Immediate Anesthesia Transfer of Care Note  Patient: Alex Peterson  Procedure(s) Performed: ARTHROPLASTY, HIP, TOTAL, ANTERIOR APPROACH (Right: Hip)  Patient Location: PACU  Anesthesia Type:General  Level of Consciousness: awake and patient cooperative  Airway & Oxygen Therapy: Patient Spontanous Breathing  Post-op Assessment: Report given to RN and Post -op Vital signs reviewed and stable  Post vital signs: stable  Last Vitals:  Vitals Value Taken Time  BP 102/69 05/19/24 09:31  Temp    Pulse 66 05/19/24 09:34  Resp 13 05/19/24 09:34  SpO2 98 % 05/19/24 09:34  Vitals shown include unfiled device data.  Last Pain:  Vitals:   05/19/24 0624  PainSc: 1          Complications: No notable events documented.

## 2024-05-19 NOTE — Interval H&P Note (Signed)
 Patient history and physical updated. Consent reviewed including risks, benefits, and alternatives to surgery. Patient agrees with above plan to proceed with right anterior total hip arthroplasty.

## 2024-05-19 NOTE — Anesthesia Preprocedure Evaluation (Signed)
 Anesthesia Evaluation  Patient identified by MRN, date of birth, ID band Patient awake    Reviewed: Allergy & Precautions, NPO status , Patient's Chart, lab work & pertinent test results  History of Anesthesia Complications Negative for: history of anesthetic complications  Airway Mallampati: IV   Neck ROM: Full    Dental  (+) Dental Advidsory Given Missing lower left tooth; right molar chipped:   Pulmonary neg pulmonary ROS   Pulmonary exam normal breath sounds clear to auscultation       Cardiovascular Exercise Tolerance: Good negative cardio ROS Normal cardiovascular exam Rhythm:Regular Rate:Normal  ECG 10/23/21:  SR with no acute ischemic changes   Neuro/Psych negative neurological ROS     GI/Hepatic negative GI ROS,,,  Endo/Other  negative endocrine ROS    Renal/GU negative Renal ROS     Musculoskeletal   Abdominal   Peds  Hematology negative hematology ROS (+)   Anesthesia Other Findings Past Medical History: No date: Hypercholesterolemia   Reproductive/Obstetrics                              Anesthesia Physical Anesthesia Plan  ASA: 1  Anesthesia Plan: Spinal   Post-op Pain Management:    Induction: Intravenous  PONV Risk Score and Plan: 2 and Propofol  infusion, TIVA and Treatment may vary due to age or medical condition  Airway Management Planned: Natural Airway and Simple Face Mask  Additional Equipment:   Intra-op Plan:   Post-operative Plan:   Informed Consent: I have reviewed the patients History and Physical, chart, labs and discussed the procedure including the risks, benefits and alternatives for the proposed anesthesia with the patient or authorized representative who has indicated his/her understanding and acceptance.       Plan Discussed with: CRNA  Anesthesia Plan Comments: (LMA/GETA backup discussed.  Patient consented for risks of anesthesia  including but not limited to:  - adverse reactions to medications - damage to eyes, teeth, lips or other oral mucosa - nerve damage due to positioning  - sore throat or hoarseness - damage to heart, brain, nerves, lungs, other parts of body or loss of life  Informed patient about role of CRNA in peri- and intra-operative care.  Patient voiced understanding.)         Anesthesia Quick Evaluation

## 2024-05-19 NOTE — H&P (Signed)
 History of Present Illness: Alex Peterson is an 53 y.o. male presents for history and physical for right anterior total hip arthroplasty with Dr. Lorelle on 05/19/2024. Patient has had years of increasing pain in the right hip. Pain has been interfering with quality of life and activities day living. Pain is moderate to severe. He has underwent cortisone injections with only a couple days worth relief. He is currently taking Tylenol  for pain. Patient used to be active and enjoyed exercising but has been able to due to the right hip pain.  Patient is a non-smoker, nondiabetic with an A1c of 6.0 and a BMI of 30.3  Past Medical History: No past medical history on file.  Past Surgical History: History reviewed. No pertinent surgical history.  Past Family History: No family history on file.  Medications: Current Outpatient Medications  Medication Sig Dispense Refill  rosuvastatin  (CRESTOR ) 40 MG tablet Take 40 mg by mouth once daily   No current facility-administered medications for this visit.   Allergies: No Known Allergies   Visit Vitals: Vitals:  04/26/24 1546  BP: 132/88    Review of Systems:  A comprehensive 14 point ROS was performed, reviewed, and the pertinent orthopaedic findings are documented in the HPI.  Physical Exam: Body mass index is 29.71 kg/m. Vitals:  04/26/24 1546  BP: 132/88  General:  Well developed, well nourished, no apparent distress, normal affect, antalgic gait with no assistive devices.  HEENT: Head normocephalic, atraumatic, PERRL.   Abdomen: Soft, non tender, non distended, Bowel sounds present.  Heart: Examination of the heart reveals regular, rate, and rhythm. There is no murmur noted on ascultation. There is a normal apical pulse.  Lungs: Lungs are clear to auscultation. There is no wheeze, rhonchi, or crackles. There is normal expansion of bilateral chest walls.   Right hip exam  SKIN: intact SWELLING: none WARMTH: no  warmth TENDERNESS: none, Stinchfield Positive ROM: 0 degrees internal rotation and 30 degrees external rotation and pain with internal rotation,; Hip Flexion 90 STRENGTH: normal GAIT: stiff-legged STABILITY: stable to testing CREPITUS: yes LEG LENGTH DISCREPANCY: none NEUROLOGICAL EXAM: normal VASCULAR EXAM: normal LUMBAR SPINE: tenderness: no straight leg raising sign: no motor exam: normal  The contralateral hip was examined for comparison and it showed: TENDERNESS: none ROM: normal and full STRENGTH: normal STABILITY: stable to testing  Hip Imaging :  I have reviewed AP pelvis and lateral hip X-rays (2 views) taken for last office visit of the right hip which reveal moderate to severe degenerative changes with complete loss of joint space with bone-on-bone articulation, osteophyte formation, sclerosis, and subchondral cyst formation. There is lateralization of the head center relative to the acetabulum consistent with severe arthritis. The left hip shows moderate degenerative changes with femoral head deformity with cam lesion osteophyte formation, sclerosis and early subchondral cyst formation. No fractures or dislocations noted about either hip or the pelvis.   Assessment:  Right hip osteoarthritis  Plan: Alex Peterson is a 54 year old male with advanced right hip osteoarthritis. Pain is improved present for greater than 1 year and has been progressively increasing and he has failed relief with conservative treatment. Patient's pain is interfering with his quality of life and activities day living. Risks, benefits, complications of a right anterior total hip arthroplasty have discussed with the patient patient has agreed and consented procedure with Dr. Lorelle on 05/19/2024.   The hospitalization and post-operative care and rehabilitation were also discussed. The use of perioperative antibiotics and DVT prophylaxis were discussed.  The risk, benefits and alternatives to a surgical  intervention were discussed at length with the patient. The patient was also advised of risks related to the medical comorbidities and elevated body mass index (BMI). A lengthy discussion took place to review the most common complications including but not limited to: deep vein thrombosis, pulmonary embolus, heart attack, stroke, infection, wound breakdown, heterotopic ossification, dislocation, numbness, leg length in-equality, intraoperative fracture, damage to nerves, tendon,muscles, arteries or other blood vessels, death and other possible complications from anesthesia. The patient was told that we will take steps to minimize these risks by using sterile technique, antibiotics and DVT prophylaxis when appropriate and follow the patient postoperatively in the office setting to monitor progress. The possibility of recurrent pain, no improvement in pain and actual worsening of pain were also discussed with the patient. The risk of dislocation following total hip replacement was discussed and potential precautions to prevent dislocation were reviewed.

## 2024-05-19 NOTE — TOC Initial Note (Signed)
 Transition of Care [facility]) - Initial/Assessment Note    Patient Details  Name: Alex Peterson MRN: [id number] Date of Birth: 05/28/1971  [facility]) CM/SW Contact:    Delphine KANDICE Bring, RN Phone Number: 05/19/2024, 1:56 PM  Clinical Narrative:                 Patient denies services ad DME use in the home prior to admission. Patient plan to go home at time of d/c. No DME orders at this time. Patient had surgery today. Waiting for therapy recommendations.     Expected Discharge Plan: Home w Home Health Services Barriers to Discharge: No Barriers Identified   Patient Goals and CMS Choice            Expected Discharge Plan and Services       Living arrangements for the past 2 months: Single Family Home                                      Prior Living Arrangements/Services Living arrangements for the past 2 months: Single Family Home Lives with:: Adult Children, Spouse   Do you feel safe going back to the place where you live?: Yes               Activities of Daily Living   ADL Screening (condition at time of admission) Independently performs ADLs?: Yes (appropriate for developmental age) Is the patient deaf or have difficulty hearing?: No Does the patient have difficulty seeing, even when wearing glasses/contacts?: No Does the patient have difficulty concentrating, remembering, or making decisions?: No  Permission Sought/Granted                  Emotional Assessment Appearance:: Appears stated age   Affect (typically observed): Calm, Appropriate Orientation: : Oriented to Self, Oriented to Place, Oriented to  Time, Oriented to Situation      Admission diagnosis:  Primary osteoarthritis of right hip [M16.11] 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] Patient Active Problem List   Diagnosis Date Noted   S/P total right hip arthroplasty 05/19/2024   Pre-op evaluation 04/18/2024   Hip pain 10/19/2023   Hyperglycemia 04/17/2023    Elevated blood pressure reading 11/29/2022   Change in vision 11/29/2022   White coat syndrome without diagnosis of hypertension 03/24/2022   Elevated ALT measurement 03/24/2022   Elevated fasting glucose 03/24/2022   Ganglion cyst of tendon sheath of right hand 03/24/2022   Pulmonary nodules 11/19/2021   Healthcare maintenance 10/29/2021   SOB (shortness of breath) on exertion 10/23/2021   Hypercholesterolemia 10/23/2021   Colon cancer screening 02/09/2021   Prostate cancer screening 02/09/2021   Allergies 02/09/2021   Pain due to varicose veins of lower extremity 03/09/2017   Varicose veins of bilateral lower extremities with pain 08/26/2016   PCP:  Glendia Shad, MD Pharmacy:   [facility] [phone] - [place], [place] - [address] [place] [address] Phone: [phone] Fax: [phone]     Social Drivers of Health (SDOH) Social History: SDOH Screenings   Food Insecurity: No Food Insecurity (05/19/2024)  Housing: Low Risk  (05/19/2024)  Transportation Needs: No Transportation Needs (05/19/2024)  Utilities: Not At Risk (05/19/2024)  Alcohol Screen: Low Risk  (04/15/2024)  Depression (PHQ2-9): Low Risk  (04/18/2024)  Financial Resource Strain: Low Risk  (04/15/2024)  Physical Activity: Sufficiently Active (04/15/2024)  Social Connections: Moderately Integrated (04/15/2024)  Stress: No Stress Concern Present (04/15/2024)  Tobacco Use: Low Risk  (05/19/2024)   SDOH Interventions:     Readmission Risk Interventions     No data to display

## 2024-05-19 NOTE — Anesthesia Procedure Notes (Signed)
 Spinal  Patient location during procedure: OR Start time: 05/19/2024 7:26 AM End time: 05/19/2024 7:32 AM Reason for block: surgical anesthesia Staffing Performed: resident/CRNA  Anesthesiologist: Dario Barter, MD Resident/CRNA: Norleen Alberta HERO., CRNA Performed by: Norleen Alberta HERO., CRNA Authorized by: Dario Barter, MD   Preanesthetic Checklist Completed: patient identified, IV checked, site marked, risks and benefits discussed, surgical consent, monitors and equipment checked, pre-op evaluation and timeout performed Spinal Block Patient position: sitting Prep: Betadine Patient monitoring: heart rate, continuous pulse ox, blood pressure and cardiac monitor Approach: midline Location: L4-5 Injection technique: single-shot Needle Needle type: Whitacre and Introducer  Needle gauge: 24 G Needle length: 9 cm Assessment Events: CSF return Additional Notes Negative paresthesia. Negative blood return. Positive free-flowing CSF. Expiration date of kit checked and confirmed. Patient tolerated procedure well, without complications.

## 2024-05-19 NOTE — Op Note (Signed)
 Patient Name: Alex Peterson  FMW:[id number]  Pre-Operative Diagnosis: Right hip Osteoarthritis  Post-Operative Diagnosis: (same)  Procedure: Right Total Hip Arthroplasty  Components/Implants: Cup: Trident Tritanium Clusterhole 52/E w/x2 screws    Liner: Neutral X3 poly 36/E  Stem: Insignia #3 High offset  Head:Biolox ceramic 36 +60mm  Date of Surgery: 05/19/2024  Surgeon: Arthea Sheer MD  Assistant: Debby Amber PA (present and scrubbed throughout the case, critical for assistance with exposure, retraction, instrumentation, and closure)   Anesthesiologist: Dario  Anesthesia: Spinal   EBL: 125cc  IVF:700cc  Complications: None   Brief history: The patient is a 53 year old male with a history of osteoarthritis of the right hip with pain limiting their range of motion and activities of daily living, which has failed multiple attempts at conservative therapy.  The risks and benefits of total hip arthroplasty as definitive surgical treatment were discussed with the patient, who opted to proceed with the operation.  After outpatient medical clearance and optimization was completed the patient was admitted to [facility] for the procedure.  All preoperative films were reviewed and an appropriate surgical plan was made prior to surgery.   Description of procedure: The patient was brought to the operating room where laterality was confirmed by all those present to be the right side.  The patient was administered spinal anesthesia on a stretcher prior to being moved supine on the operating room table. Patient was given an intravenous dose of antibiotics for surgical prophylaxis and TXA.  All bony prominences and extremities were well padded and the patient was securely attached to the table boots, a perineal post was placed and the patient had a safety strap placed.  Surgical site was prepped with alcohol and chlorhexidine . The surgical site over the hip was and draped in  typical sterile fashion with multiple layers of adhesive and nonadhesive drapes.  The incision site was marked out with a sterile marker and care was taken to assess the position of the ASIS and ensure appropriate position for the incision.    A surgical timeout was then called with participation of all staff in the room the patient was then a confirmed again and laterality confirmed.  Incision was made over the anterior lateral aspect of the proximal thigh in line with the TFL.  Appropriate retractors were placed and all bleeding vessels were coagulated within the subcutaneous and fatty layers.  An incision was made in the TFL fascia in the interval was carefully identified.  The lateral ascending branches of the circumflex vessels were identified, cauterized and carefully dissected. The main vessels were then tied with a 0 silk hand tie.  Retractors were placed around the superior lateral and inferior medial aspects of the femoral neck and a capsulotomy was performed exposing the hip joint.  Retraction stitches were placed and the capsulotomy to assist with visualization.  Femoral neck cut was then made and the femoral head was extracted after placing the leg in traction.  Bone wax was then applied to the proximal cut surface of the femur and water  cooled bipolar electrocautery was used to address any bleeding around the femoral neck cut.  Retractors were then placed around the acetabulum to fully visualize the joint space, and the remaining labral tissue was removed and pulvinar was removed.   The acetabulum was then sequentially reamed up to the appropriate size in order to get good fit and fill for the acetabular component while under fluoroscopic guidance.  Acetabular component was then placed and malleted  into a secure fit while confirming position and abduction angle and anteversion utilizing fluoroscopy.  2 screws were then placed in the acetabular cup to assist in securing the cup in place. The cup  was irrigated,  a real neutral liner was placed, impacted, and checked for stability. The femur traction was dropped and sequentially externally rotated while performing a release of the posterior and superomedial tissues off of the proximal femur to allow for mobility, care was taken to preserve the external rotators and piriformis attachments.  The remaining interval between the abductors and the capsule was dissected out and a retractor was placed over the superolateral aspect of the femur over the greater trochanter.  The leg was carefully brought down into extension and adducted to provide visualization of the proximal femur for broaching.  The femur was then sequentially broached up to an appropriate size which provided for good fill and stability to the femoral broach.  A trial neck and head were placed on the femoral broach and the leg was brought up for reduction.  The hip was reduced and manual check of stability was performed.  The hip was found to be stable in flexion internal rotation and extension external rotation.  Leg lengths were confirmed on fluoroscopy.   The hip was then dislocated the trial neck and head were removed.  The leg was then brought down into extension and adduction in the proximal femur was reexposed.  The broach trial was removed and the femur was irrigated with normal saline prior to the real femoral stem being implanted.  After the femoral stem was seated and shown to have good fit and fill the appropriate head was impacted the leg was brought up and reduced.  There was good range of motion with stability in flexion internal rotation and extension external rotation on testing.  Leg lengths were found to be appropriate on fluoroscopic evaluation at this time.  The hip was then irrigated with betdine based surgiphor solution and then saline solution.  The capsulotomy was repaired with Ethibond sutures.  A pericapsular and peritrochanteric cocktail with Exparel  and bupivacaine  was  then injected as well as the subcutaneous tissues. The fascia was closed with a #1 barbed running suture.  The deep tissues were closed with Vicryl sutures the subcutaneous tissues were closed with interrupted Vicryl sutures and a running barbed 4-0 suture.  The skin was then reinforced with Dermabond and a sterile dressing was placed.   The patient was awoken from anesthesia transferred off of the operating room table onto a hospital bed where examination of leg lengths found the leg lengths to be equal with a good distal pulse.  The patient was then transferred to the PACU in stable condition.

## 2024-05-20 ENCOUNTER — Encounter: Payer: Self-pay | Admitting: Orthopedic Surgery

## 2024-05-20 DIAGNOSIS — M1611 Unilateral primary osteoarthritis, right hip: Secondary | ICD-10-CM | POA: Diagnosis not present

## 2024-05-20 LAB — BASIC METABOLIC PANEL WITH GFR
Anion gap: 8 (ref 5–15)
BUN: 17 mg/dL (ref 6–20)
CO2: 25 mmol/L (ref 22–32)
Calcium: 8.7 mg/dL — ABNORMAL LOW (ref 8.9–10.3)
Chloride: 104 mmol/L (ref 98–111)
Creatinine, Ser: 0.77 mg/dL (ref 0.61–1.24)
GFR, Estimated: >60 mL/min (ref >60)
Glucose, Bld: 141 mg/dL — ABNORMAL HIGH (ref 70–99)
Potassium: 4.3 mmol/L (ref 3.5–5.1)
Sodium: 137 mmol/L (ref 135–145)

## 2024-05-20 LAB — CBC
HCT: 39.2 % (ref 39.0–52.0)
Hemoglobin: 13.1 g/dL (ref 13.0–17.0)
MCH: 31.8 pg (ref 26.0–34.0)
MCHC: 33.4 g/dL (ref 30.0–36.0)
MCV: 95.1 fL (ref 80.0–100.0)
Platelets: 242 K/uL (ref 150–400)
RBC: 4.12 MIL/uL — ABNORMAL LOW (ref 4.22–5.81)
RDW: 12.4 % (ref 11.5–15.5)
WBC: 18.7 K/uL — ABNORMAL HIGH (ref 4.0–10.5)
nRBC: 0 % (ref 0.0–0.2)

## 2024-05-20 MED ORDER — OXYCODONE HCL 5 MG PO TABS: 2.5000 mg | ORAL_TABLET | Freq: Three times a day (TID) | ORAL | 0 refills | Status: DC | PRN

## 2024-05-20 MED ORDER — ASPIRIN 81 MG PO CHEW: 81.0000 mg | CHEWABLE_TABLET | Freq: Two times a day (BID) | ORAL | 0 refills | Status: AC

## 2024-05-20 MED ORDER — ONDANSETRON HCL 4 MG PO TABS: 4.0000 mg | ORAL_TABLET | Freq: Four times a day (QID) | ORAL | 0 refills | Status: DC | PRN

## 2024-05-20 MED ORDER — TRAMADOL HCL 50 MG PO TABS: 50.0000 mg | ORAL_TABLET | Freq: Four times a day (QID) | ORAL | 0 refills | Status: DC | PRN

## 2024-05-20 MED ORDER — CELECOXIB 200 MG PO CAPS: 200.0000 mg | ORAL_CAPSULE | Freq: Two times a day (BID) | ORAL | 0 refills | Status: AC

## 2024-05-20 MED ORDER — ACETAMINOPHEN 500 MG PO TABS: 1000.0000 mg | ORAL_TABLET | Freq: Three times a day (TID) | ORAL | 0 refills | Status: DC

## 2024-05-20 MED ORDER — DOCUSATE SODIUM 100 MG PO CAPS: 100.0000 mg | ORAL_CAPSULE | Freq: Two times a day (BID) | ORAL | 0 refills | Status: DC

## 2024-05-20 NOTE — Discharge Summary (Signed)
 Physician Discharge Summary  Patient ID: Alex Peterson MRN: [id number] DOB/AGE: 1971/05/27 53 y.o.  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]   Discharge Diagnoses: Patient Active Problem List   Diagnosis Date Noted   S/P total right hip arthroplasty 05/19/2024   Pre-op evaluation 04/18/2024   Hip pain 10/19/2023   Hyperglycemia 04/17/2023   Elevated blood pressure reading 11/29/2022   Change in vision 11/29/2022   White coat syndrome without diagnosis of hypertension 03/24/2022   Elevated ALT measurement 03/24/2022   Elevated fasting glucose 03/24/2022   Ganglion cyst of tendon sheath of right hand 03/24/2022   Pulmonary nodules 11/19/2021   Healthcare maintenance 10/29/2021   SOB (shortness of breath) on exertion 10/23/2021   Hypercholesterolemia 10/23/2021   Colon cancer screening 02/09/2021   Prostate cancer screening 02/09/2021   Allergies 02/09/2021   Pain due to varicose veins of lower extremity 03/09/2017   Varicose veins of bilateral lower extremities with pain 08/26/2016    Past Medical History:  Diagnosis Date   Hypercholesterolemia      Transfusion: none   Consultants (if any):   Discharged Condition: Improved  Hospital Course: Alex Peterson is an 53 y.o. male who was admitted 05/19/2024 with a diagnosis of S/P total right hip arthroplasty and went to the operating room on 05/19/2024 and underwent the above named procedures.    Surgeries: Procedure(s): ARTHROPLASTY, HIP, TOTAL, ANTERIOR APPROACH on 05/19/2024 Patient tolerated the surgery well. Taken to PACU where she was stabilized and then transferred to the orthopedic floor.  Started on aspirin , TEDs and SCDs applied bilaterally. Heels elevated on bed. No evidence of DVT. Negative Homan. Physical therapy started on day #1 for gait training and transfer. OT started day #1 for ADL and assisted  devices.  Patient's IV was d/c on day #1. Patient was able to safely and independently complete all PT goals. PT recommending discharge to home.    On post op day #1 patient was stable and ready for discharge to home with HHPT.  Implants: Trident Tritanium Clusterhole 52/E w/x2 screws    Liner: Neutral X3 poly 36/E  Stem: Insignia #3 High offset  Head:Biolox ceramic 36 +8mm   He was given perioperative antibiotics:  Anti-infectives (From admission, onward)    Start     Dose/Rate Route Frequency Ordered Stop   05/19/24 1330  ceFAZolin  (ANCEF ) IVPB 2g/100 mL premix        2 g 200 mL/hr over 30 Minutes Intravenous Every 6 hours 05/19/24 0951 05/19/24 2202   05/19/24 0600  ceFAZolin  (ANCEF ) IVPB 2g/100 mL premix        2 g 200 mL/hr over 30 Minutes Intravenous On call to O.R. 05/19/24 [id number] 05/19/24 0747     .  He was given sequential compression devices, early ambulation, and aspirin  TEDs for DVT prophylaxis.  He benefited maximally from the hospital stay and there were no complications.    Recent vital signs:  Vitals:   05/20/24 0506 05/20/24 0731  BP: 104/69 115/64  Pulse: 76 65  Resp: 18 18  Temp: (!) 97.5 F (36.4 C) (!) 97.1 F (36.2 C)  SpO2: 98% 98%    Recent laboratory studies:  Lab Results  Component Value Date   HGB 13.1 05/20/2024   HGB 15.2 05/09/2024   HGB 15.0 06/17/2023   Lab Results  Component Value Date   WBC 18.7 (H) 05/20/2024   PLT 242 05/20/2024  No results found for: INR Lab Results  Component Value Date   NA 137 05/20/2024   K 4.3 05/20/2024   CL 104 05/20/2024   CO2 25 05/20/2024   BUN 17 05/20/2024   CREATININE 0.77 05/20/2024   GLUCOSE 141 (H) 05/20/2024    Discharge Medications:   Allergies as of 05/20/2024   No Known Allergies      Medication List     STOP taking these medications    ibuprofen 200 MG tablet Commonly known as: ADVIL       TAKE these medications    acetaminophen  500 MG tablet Commonly known as:  TYLENOL  Take 2 tablets (1,000 mg total) by mouth every 8 (eight) hours. What changed:  when to take this reasons to take this   aspirin  81 MG chewable tablet Chew 1 tablet (81 mg total) by mouth 2 (two) times daily.   celecoxib 200 MG capsule Commonly known as: CeleBREX Take 1 capsule (200 mg total) by mouth 2 (two) times daily for 14 days.   chlorhexidine  4 % external liquid Commonly known as: HIBICLENS  Apply 15 mLs (1 Application total) topically as directed for 30 doses. Use as directed daily for 5 days every other week for 6 weeks.   docusate sodium  100 MG capsule Commonly known as: COLACE Take 1 capsule (100 mg total) by mouth 2 (two) times daily.   mupirocin  ointment 2 % Commonly known as: BACTROBAN  Place 1 Application into the nose 2 (two) times daily for 60 doses. Use as directed 2 times daily for 5 days every other week for 6 weeks.   ondansetron  4 MG tablet Commonly known as: ZOFRAN  Take 1 tablet (4 mg total) by mouth every 6 (six) hours as needed for nausea.   oxyCODONE 5 MG immediate release tablet Commonly known as: Roxicodone Take 0.5-1 tablets (2.5-5 mg total) by mouth every 8 (eight) hours as needed for breakthrough pain.   rosuvastatin  40 MG tablet Commonly known as: CRESTOR  TAKE 1 TABLET BY MOUTH EVERY DAY   traMADol  50 MG tablet Commonly known as: ULTRAM  Take 1 tablet (50 mg total) by mouth every 6 (six) hours as needed for moderate pain (pain score 4-6).               Durable Medical Equipment  (From admission, onward)           Start     Ordered   05/20/24 0809  For home use only DME Walker rolling  Once       Question Answer Comment  Walker: With 5 Inch Wheels   Patient needs a walker to treat with the following condition Status post total hip replacement, right      05/20/24 0808   05/20/24 0809  For home use only DME 3 n 1  Once        05/20/24 0808            Diagnostic Studies: DG HIP UNILAT W OR W/O PELVIS 2-3 VIEWS  RIGHT Result Date: 05/19/2024 CLINICAL DATA:  Right total hip arthroplasty intraoperative imaging EXAM: DG HIP (WITH OR WITHOUT PELVIS) 2-3V RIGHT COMPARISON:  Radiographs 11/06/2023 FINDINGS: Fluoroscopic spot images demonstrate right total hip prosthesis in place with expected alignment. No periprosthetic fracture or acute complicating feature observed. IMPRESSION: 1. Right total hip prosthesis in place; no complicating features observed. Electronically Signed   By: Ryan Salvage M.D.   On: 05/19/2024 12:54   DG C-Arm 1-60 Min-No Report Result Date: 05/19/2024 Fluoroscopy was  utilized by the requesting physician.  No radiographic interpretation.   DG C-Arm 1-60 Min-No Report Result Date: 05/19/2024 Fluoroscopy was utilized by the requesting physician.  No radiographic interpretation.    Disposition: Discharge disposition: 06-Home-Health Care Svc          Follow-up Information     Charlene Debby BROCKS, PA-C Follow up in 2 week(s).   Specialties: Orthopedic Surgery, Emergency Medicine Contact information: [address] [place] [address] [phone]                  Signed: Debby BROCKS Charlene 05/20/2024, 8:11 AM

## 2024-05-20 NOTE — Discharge Instructions (Signed)

## 2024-05-20 NOTE — Anesthesia Postprocedure Evaluation (Signed)
 Anesthesia Post Note  Patient: Izek Santillo  Procedure(s) Performed: ARTHROPLASTY, HIP, TOTAL, ANTERIOR APPROACH (Right: Hip)  Patient location during evaluation: Nursing Unit Anesthesia Type: Spinal Level of consciousness: oriented and awake and alert Pain management: pain level controlled Vital Signs Assessment: post-procedure vital signs reviewed and stable Respiratory status: spontaneous breathing and respiratory function stable Cardiovascular status: blood pressure returned to baseline and stable Postop Assessment: no headache, no backache, no apparent nausea or vomiting and patient able to bend at knees Anesthetic complications: no   There were no known notable events for this encounter.   Last Vitals:  Vitals:   05/19/24 2357 05/20/24 0506  BP: 124/77 104/69  Pulse: 65 76  Resp: 18 18  Temp: (!) 36.1 C (!) 36.4 C  SpO2: 97% 98%    Last Pain:  Vitals:   05/20/24 0510  TempSrc:   PainSc: 2                  Imberly Troxler Belinda

## 2024-05-20 NOTE — Progress Notes (Signed)
 Patient is not able to walk the distance required to go the bathroom, or he is unable to safely negotiate stairs required to access the bathroom.  A 3in1 BSC will alleviate this problem.        Lollie Marrow, PA-C [facility]

## 2024-05-20 NOTE — Plan of Care (Signed)
  Problem: Activity: Goal: Ability to tolerate increased activity will improve Outcome: Progressing   Problem: Clinical Measurements: Goal: Postoperative complications will be avoided or minimized Outcome: Progressing

## 2024-05-20 NOTE — TOC Progression Note (Addendum)
 Transition of Care [facility]) - Progression Note    Patient Details  Name: [name] MRN: [id number] Date of Birth: [birth year]  Transition of Care [facility]) CM/SW Contact  Alex Peterson  [place], [place] Phone Number: 05/20/2024, 9:10 AM  Clinical Narrative:     LCSWA met with patient and spouse at bedside to discuss DME recommendation of BSC/3in1 and rolling walker. Patient was agreeable with recommendation. LCSWA spoke with [facility] coordinator to set up delivery of DME. [facility] coordinator has started the process of DME delivery to patient.   TOC Signing Off    Expected Discharge Plan: Home w Home Health Services Barriers to Discharge: No Barriers Identified  Expected Discharge Plan and Services       Living arrangements for the past 2 months: Single Family Home Expected Discharge Date: 05/20/24                                     Social Determinants of Health (SDOH) Interventions SDOH Screenings   Food Insecurity: No Food Insecurity (05/19/2024)  Housing: Low Risk  (05/19/2024)  Transportation Needs: No Transportation Needs (05/19/2024)  Utilities: Not At Risk (05/19/2024)  Alcohol Screen: Low Risk  (04/15/2024)  Depression (PHQ2-9): Low Risk  (04/18/2024)  Financial Resource Strain: Low Risk  (04/15/2024)  Physical Activity: Sufficiently Active (04/15/2024)  Social Connections: Moderately Integrated (04/15/2024)  Stress: No Stress Concern Present (04/15/2024)  Tobacco Use: Low Risk  (05/19/2024)    Readmission Risk Interventions     No data to display

## 2024-05-20 NOTE — TOC Transition Note (Signed)
 Transition of Care [facility]) - Discharge Note   Patient Details  Name: Alex Peterson MRN: [id number] Date of Birth: 01-05-1971  Transition of Care [facility]) CM/SW Contact:  Alvaro Louder, LCSW Phone Number: 05/20/2024, 11:45 AM   Clinical Narrative:     DME has been delivered to patient. [facility] set up prior to admission. No TOC needs at this time.    TOC Signing Off     Barriers to Discharge: No Barriers Identified   Patient Goals and CMS Choice            Discharge Placement                       Discharge Plan and Services Additional resources added to the After Visit Summary for                                       Social Drivers of Health (SDOH) Interventions SDOH Screenings   Food Insecurity: No Food Insecurity (05/19/2024)  Housing: Low Risk  (05/19/2024)  Transportation Needs: No Transportation Needs (05/19/2024)  Utilities: Not At Risk (05/19/2024)  Alcohol Screen: Low Risk  (04/15/2024)  Depression (PHQ2-9): Low Risk  (04/18/2024)  Financial Resource Strain: Low Risk  (04/15/2024)  Physical Activity: Sufficiently Active (04/15/2024)  Social Connections: Moderately Integrated (04/15/2024)  Stress: No Stress Concern Present (04/15/2024)  Tobacco Use: Low Risk  (05/19/2024)     Readmission Risk Interventions     No data to display

## 2024-05-20 NOTE — Progress Notes (Signed)
   Subjective: 1 Day Post-Op Procedure(s) (LRB): ARTHROPLASTY, HIP, TOTAL, ANTERIOR APPROACH (Right) Patient reports pain as mild.   Patient is well, and has had no acute complaints or problems Denies any CP, SOB, ABD pain. We will continue therapy today.  Plan is to go Home after hospital stay.  Objective: Vital signs in last 24 hours: Temp:  [97 F (36.1 C)-98.7 F (37.1 C)] 97.1 F (36.2 C) (07/18 0731) Pulse Rate:  [57-90] 65 (07/18 0731) Resp:  [9-18] 18 (07/18 0731) BP: (94-124)/(58-84) 115/64 (07/18 0731) SpO2:  [96 %-100 %] 98 % (07/18 0731) Weight:  [98.4 kg] 98.4 kg (07/17 1208)  Intake/Output from previous day: 07/17 0701 - 07/18 0700 In: 1242.4 [P.O.:240; I.V.:523.7; IV Piggyback:478.7] Out: 125 [Blood:125] Intake/Output this shift: No intake/output data recorded.  Recent Labs    05/20/24 0522  HGB 13.1   Recent Labs    05/20/24 0522  WBC 18.7*  RBC 4.12*  HCT 39.2  PLT 242   Recent Labs    05/20/24 0522  NA 137  K 4.3  CL 104  CO2 25  BUN 17  CREATININE 0.77  GLUCOSE 141*  CALCIUM  8.7*   No results for input(s): LABPT, INR in the last 72 hours.  EXAM General - Patient is Alert, Appropriate, and Oriented Extremity - Neurovascular intact Sensation intact distally Intact pulses distally Dorsiflexion/Plantar flexion intact Dressing - dressing C/D/I and no drainage Motor Function - intact, moving foot and toes well on exam.   Past Medical History:  Diagnosis Date   Hypercholesterolemia     Assessment/Plan:   1 Day Post-Op Procedure(s) (LRB): ARTHROPLASTY, HIP, TOTAL, ANTERIOR APPROACH (Right) Principal Problem:   S/P total right hip arthroplasty  Estimated body mass index is 30.27 kg/m as calculated from the following:   Height as of this encounter: 5' 11 (1.803 m).   Weight as of this encounter: 98.4 kg. Advance diet Up with therapy Pain well controlled Labs and VSS CM to assist with discharge to home with HHPT  today  DVT Prophylaxis - Aspirin , TED hose, and SCDs Weight-Bearing as tolerated to right leg   T. Medford Amber, PA-C [facility] 05/20/2024, 8:06 AM

## 2024-05-20 NOTE — Progress Notes (Signed)
 Patient discharging home, iv removed. Dressing clean dry and intact. Instructions given to patient, verbalized understanding. No concerns. Family transporting patient home.

## 2024-05-20 NOTE — Progress Notes (Signed)
 Physical Therapy Treatment Patient Details Name: Alex Peterson MRN: [id number] DOB: 06/29/1971 Today's Date: 05/20/2024   History of Present Illness Pt admitted for R THR and is POD 0 at time of evaluation. No PMH    PT Comments  Pt received upright in bed agreeable to PT. Spouse joining session shortly after. Pt is indep to mod-I with bed mobility and STS to RW ambulating 250' using RW at supervision. Normalized gait appreciated. Education provided on asc/desc steps and how spouse can assist with RW. Pt completes x2 without issue with correct LE sequencing and no balance problems. Pt returns to room in bed reviewing and performing HEP as listed below. All education complete as listed below. Pt pending DME still however is appropriate/ready to d/c when medically appropriate meeting all PT goals.     If plan is discharge home, recommend the following: A little help with walking and/or transfers   Can travel by private vehicle        Equipment Recommendations  Rolling walker (2 wheels);BSC/3in1    Recommendations for Other Services       Precautions / Restrictions Precautions Precautions: Fall;Anterior Hip Precaution Booklet Issued: Yes (comment) Recall of Precautions/Restrictions: Intact Restrictions Weight Bearing Restrictions Per Provider Order: Yes RLE Weight Bearing Per Provider Order: Weight bearing as tolerated     Mobility  Bed Mobility Overal bed mobility: Independent               Patient Response: Cooperative  Transfers Overall transfer level: Modified independent Equipment used: Rolling walker (2 wheels)               General transfer comment: safe use of hands    Ambulation/Gait Ambulation/Gait assistance: Modified independent (Device/Increase time) Gait Distance (Feet): 250 Feet Assistive device: Rolling walker (2 wheels) Gait Pattern/deviations: Step-through pattern       General Gait Details: equal and symmetric steps and foot  clearance   Stairs Stairs: Yes Stairs assistance: Modified independent (Device/Increase time) Stair Management: One rail Left, Step to pattern, Forwards Number of Stairs: 4 General stair comments: asc/desc twice. Reviewed use of spouse for assisting in RW mgmt. Pt and spouse comfortable with completion and verbalize understanding.   Wheelchair Mobility     Tilt Bed Tilt Bed Patient Response: Cooperative  Modified Rankin (Stroke Patients Only)       Balance Overall balance assessment: Independent                                          Communication Communication Communication: No apparent difficulties  Cognition Arousal: Alert Behavior During Therapy: WFL for tasks assessed/performed   PT - Cognitive impairments: No apparent impairments                       PT - Cognition Comments: pleasant and agreeable to session Following commands: Intact      Cueing Cueing Techniques: Verbal cues  Exercises Total Joint Exercises Ankle Circles/Pumps: AROM, Both, 5 reps, Supine Quad Sets: AROM, Strengthening, Right, 5 reps, Supine Gluteal Sets: AROM, Both, 5 reps, Supine Short Arc Quad: AROM, Strengthening, Right, 5 reps, Supine Heel Slides: AROM, Right, 5 reps, Supine Hip ABduction/ADduction: AROM, Strengthening, Right, 5 reps, Supine Straight Leg Raises: AROM, Strengthening, Right, 5 reps, Supine Other Exercises Other Exercises: Provided HEP with education on reps/sets/frequency. Discussed car transfer, precautions, stair training, RW height.    General  Comments        Pertinent Vitals/Pain Pain Assessment Pain Assessment: 0-10 Pain Score: 0-No pain    Home Living                          Prior Function            PT Goals (current goals can now be found in the care plan section) Acute Rehab PT Goals Patient Stated Goal: to go home PT Goal Formulation: With patient Time For Goal Achievement: 06/02/24 Progress towards  PT goals: Progressing toward goals    Frequency    BID      PT Plan      Co-evaluation              AM-PAC PT 6 Clicks Mobility   Outcome Measure  Help needed turning from your back to your side while in a flat bed without using bedrails?: None Help needed moving from lying on your back to sitting on the side of a flat bed without using bedrails?: None Help needed moving to and from a bed to a chair (including a wheelchair)?: A Little Help needed standing up from a chair using your arms (e.g., wheelchair or bedside chair)?: A Little Help needed to walk in hospital room?: A Little Help needed climbing 3-5 steps with a railing? : A Little 6 Click Score: 20    End of Session   Activity Tolerance: Patient tolerated treatment well Patient left: in bed;with call bell/phone within reach;with family/visitor present Nurse Communication: Mobility status PT Visit Diagnosis: Muscle weakness (generalized) (M62.81);Difficulty in walking, not elsewhere classified (R26.2);Pain Pain - Right/Left: Right Pain - part of body: Hip     Time: 9164-9145 PT Time Calculation (min) (ACUTE ONLY): 19 min  Charges:    $Therapeutic Activity: 8-22 mins PT General Charges $$ ACUTE PT VISIT: 1 Visit                    Dorina HERO. Fairly IV, PT, DPT Physical Therapist- [place]  [facility] 05/20/2024, 9:02 AM

## 2024-06-22 ENCOUNTER — Ambulatory Visit: Admitting: Internal Medicine

## 2024-08-03 ENCOUNTER — Other Ambulatory Visit: Payer: Self-pay | Admitting: Internal Medicine

## 2024-10-18 ENCOUNTER — Encounter: Payer: Self-pay | Admitting: Internal Medicine

## 2024-10-18 ENCOUNTER — Ambulatory Visit: Admitting: Internal Medicine

## 2024-10-18 VITALS — BP 130/82 | HR 88 | Temp 98.1°F | Ht 71.0 in | Wt 234.0 lb

## 2024-10-18 DIAGNOSIS — E7800 Pure hypercholesterolemia, unspecified: Secondary | ICD-10-CM

## 2024-10-18 DIAGNOSIS — D72829 Elevated white blood cell count, unspecified: Secondary | ICD-10-CM | POA: Diagnosis not present

## 2024-10-18 DIAGNOSIS — Z1211 Encounter for screening for malignant neoplasm of colon: Secondary | ICD-10-CM

## 2024-10-18 DIAGNOSIS — R918 Other nonspecific abnormal finding of lung field: Secondary | ICD-10-CM

## 2024-10-18 DIAGNOSIS — R739 Hyperglycemia, unspecified: Secondary | ICD-10-CM | POA: Diagnosis not present

## 2024-10-18 DIAGNOSIS — Z96641 Presence of right artificial hip joint: Secondary | ICD-10-CM | POA: Diagnosis not present

## 2024-10-18 LAB — CBC WITH DIFFERENTIAL/PLATELET
Basophils Absolute: 0.1 K/uL (ref 0.0–0.1)
Basophils Relative: 0.8 % (ref 0.0–3.0)
Eosinophils Absolute: 0.1 K/uL (ref 0.0–0.7)
Eosinophils Relative: 1.8 % (ref 0.0–5.0)
HCT: 43.9 % (ref 39.0–52.0)
Hemoglobin: 15.1 g/dL (ref 13.0–17.0)
Lymphocytes Relative: 25.2 % (ref 12.0–46.0)
Lymphs Abs: 1.6 K/uL (ref 0.7–4.0)
MCHC: 34.4 g/dL (ref 30.0–36.0)
MCV: 92.7 fl (ref 78.0–100.0)
Monocytes Absolute: 0.5 K/uL (ref 0.1–1.0)
Monocytes Relative: 8.6 % (ref 3.0–12.0)
Neutro Abs: 4 K/uL (ref 1.4–7.7)
Neutrophils Relative %: 63.6 % (ref 43.0–77.0)
Platelets: 264 K/uL (ref 150.0–400.0)
RBC: 4.74 Mil/uL (ref 4.22–5.81)
RDW: 13.6 % (ref 11.5–15.5)
WBC: 6.3 K/uL (ref 4.0–10.5)

## 2024-10-18 LAB — BASIC METABOLIC PANEL WITH GFR
BUN: 13 mg/dL (ref 6–23)
CO2: 28 meq/L (ref 19–32)
Calcium: 9.6 mg/dL (ref 8.4–10.5)
Chloride: 103 meq/L (ref 96–112)
Creatinine, Ser: 0.8 mg/dL (ref 0.40–1.50)
GFR: 101.04 mL/min (ref >60.00)
Glucose, Bld: 116 mg/dL — ABNORMAL HIGH (ref 70–99)
Potassium: 4.4 meq/L (ref 3.5–5.1)
Sodium: 139 meq/L (ref 135–145)

## 2024-10-18 LAB — HEPATIC FUNCTION PANEL
ALT: 33 U/L (ref 0–53)
AST: 20 U/L (ref 5–37)
Albumin: 4.6 g/dL (ref 3.5–5.2)
Alkaline Phosphatase: 80 U/L (ref 39–117)
Bilirubin, Direct: 0.1 mg/dL (ref 0.0–0.3)
Total Bilirubin: 0.4 mg/dL (ref 0.2–1.2)
Total Protein: 6.7 g/dL (ref 6.0–8.3)

## 2024-10-18 LAB — LIPID PANEL
Cholesterol: 156 mg/dL (ref 28–200)
HDL: 52.2 mg/dL (ref >39.00)
LDL Cholesterol: 84 mg/dL (ref 0–99)
NonHDL: 103.3
Total CHOL/HDL Ratio: 3
Triglycerides: 98 mg/dL (ref 0.0–149.0)
VLDL: 19.6 mg/dL (ref 0.0–40.0)

## 2024-10-18 LAB — HEMOGLOBIN A1C: Hgb A1c MFr Bld: 5.9 % (ref 4.6–6.5)

## 2024-10-18 MED ORDER — ROSUVASTATIN CALCIUM 40 MG PO TABS: 40.0000 mg | ORAL_TABLET | Freq: Every day | ORAL | 1 refills | Status: AC

## 2024-10-18 NOTE — Progress Notes (Signed)
 "  Subjective:    Patient ID: Alex Peterson, male    DOB: 08-04-1971, 53 y.o.   MRN: [id number]  Patient here for  Chief Complaint  Patient presents with   Medical Management of Chronic Issues    HPI Here for a scheduled follow up - follow up regarding hypercholesterolemia. S/p right anterior total hip replacement. Doing well s/p hip replacement. Getting more active. Had calcium  score 11/2021.  CT chest - Small pulmonary nodules measuring 2-5 mm in size, nonspecific, but statistically likely benign. Recommended f/u CT chest one year. CT chest 12/2023 - Small pulmonary nodules are stable from 2023 exam consistent with definitively benign process. A tiny punctate left lower lobe nodule was not previously included in the field of view, however likely benign. For low risk patients, no further imaging follow-up is recommended. Saw pulmonary. Felt stable. Recommended f/u in 12-18 months. Reports no chest pain or increased sob. No abdominal pain or bowel change reported. Had questions regarding shingles vaccine. Reports has not had chicken pox. Request varicella titer.    Past Medical History:  Diagnosis Date   Hypercholesterolemia    Past Surgical History:  Procedure Laterality Date   COLONOSCOPY WITH PROPOFOL  N/A 01/22/2022   Procedure: COLONOSCOPY WITH PROPOFOL ;  Surgeon: Therisa Bi, MD;  Location: [facility] ENDOSCOPY;  Service: Gastroenterology;  Laterality: N/A;   HERNIA REPAIR Bilateral    goin   TOTAL HIP ARTHROPLASTY Right 05/19/2024   Procedure: ARTHROPLASTY, HIP, TOTAL, ANTERIOR APPROACH;  Surgeon: Lorelle Hussar, MD;  Location: [facility] ORS;  Service: Orthopedics;  Laterality: Right;   VASECTOMY     Family History  Problem Relation Age of Onset   Varicose Veins Mother    Miscarriages / Stillbirths Mother    Thyroid  disease Mother    Heart disease Father    Stroke Father    Thyroid  disease Sister    Cancer Sister    Thyroid  disease Sister    Cancer Sister        ovarian cancer   Heart  attack Maternal Grandmother    Thyroid  disease Maternal Grandmother    Cancer Maternal Grandfather        liver cancer   Heart attack Paternal Grandmother    Social History   Socioeconomic History   Marital status: Married    Spouse name: Not on file   Number of children: 3   Years of education: Not on file   Highest education level: Bachelor's degree (e.g., BA, AB, BS)  Occupational History   Not on file  Tobacco Use   Smoking status: Never   Smokeless tobacco: Never  Vaping Use   Vaping status: Never Used  Substance and Sexual Activity   Alcohol use: Yes    Comment: occasional   Drug use: No   Sexual activity: Yes  Other Topics Concern   Not on file  Social History Narrative   Not on file   Social Drivers of Health   Tobacco Use: Low Risk (10/28/2024)   Patient History    Smoking Tobacco Use: Never    Smokeless Tobacco Use: Never    Passive Exposure: Not on file  Financial Resource Strain: Low Risk  (10/04/2024)   [facility]   Overall Financial Resource Strain (CARDIA)    Difficulty of Paying Living Expenses: Not hard at all  Food Insecurity: No Food Insecurity (10/04/2024)   Received from [facility]   Epic    Within the past 12 months, you worried  that your food would run out before you got the money to buy more.: Never true    Within the past 12 months, the food you bought just didn't last and you didn't have money to get more.: Never true  Transportation Needs: No Transportation Needs (10/04/2024)   Received from [facility] - Transportation    In the past 12 months, has lack of transportation kept you from medical appointments or from getting medications?: No    Lack of Transportation (Non-Medical): No  Physical Activity: Sufficiently Active (04/15/2024)   Exercise Vital Sign    Days of Exercise per Week: 5 days    Minutes of Exercise per Session: 30 min  Stress: No Stress Concern  Present (04/15/2024)   [employer] of Occupational Health - Occupational Stress Questionnaire    Feeling of Stress: Only a little  Social Connections: Moderately Integrated (04/15/2024)   Social Connection and Isolation Panel    Frequency of Communication with Friends and Family: More than three times a week    Frequency of Social Gatherings with Friends and Family: Twice a week    Attends Religious Services: 1 to 4 times per year    Active Member of [employer] or Organizations: No    Attends Engineer, Structural: Not on file    Marital Status: Married  Depression (PHQ2-9): Low Risk (10/18/2024)   Depression (PHQ2-9)    PHQ-2 Score: 0  Alcohol Screen: Low Risk (04/15/2024)   Alcohol Screen    Last Alcohol Screening Score (AUDIT): 3  Housing: Low Risk  (10/04/2024)   Received from [facility]   Epic    In the last 12 months, was there a time when you were not able to pay the mortgage or rent on time?: No    In the past 12 months, how many times have you moved where you were living?: 0    At any time in the past 12 months, were you homeless or living in a shelter (including now)?: No  Utilities: Not At Risk (10/04/2024)   Received from [facility]   Epic    In the past 12 months has the electric, gas, oil, or water  company threatened to shut off services in your home?: No  Health Literacy: Not on file     Review of Systems  Constitutional:  Negative for appetite change and unexpected weight change.  HENT:  Negative for congestion and sinus pressure.   Respiratory:  Negative for cough, chest tightness and shortness of breath.   Cardiovascular:  Negative for chest pain, palpitations and leg swelling.  Gastrointestinal:  Negative for abdominal pain, diarrhea, nausea and vomiting.  Genitourinary:  Negative for difficulty urinating and dysuria.  Musculoskeletal:  Negative for back pain and myalgias.  Skin:  Negative for color change and rash.   Neurological:  Negative for dizziness and headaches.  Psychiatric/Behavioral:  Negative for agitation and dysphoric mood.        Objective:     BP 130/82   Pulse 88   Temp 98.1 F (36.7 C) (Oral)   Ht 5' 11 (1.803 m)   Wt 234 lb (106.1 kg)   SpO2 96%   BMI 32.64 kg/m  Wt Readings from Last 3 Encounters:  10/18/24 234 lb (106.1 kg)  05/19/24 217 lb (98.4 kg)  05/09/24 217 lb (98.4 kg)    Physical Exam Vitals reviewed.  Constitutional:      General: He is not in  acute distress.    Appearance: Normal appearance. He is well-developed.  HENT:     Head: Normocephalic and atraumatic.     Right Ear: External ear normal.     Left Ear: External ear normal.     Mouth/Throat:     Pharynx: No oropharyngeal exudate or posterior oropharyngeal erythema.  Eyes:     General: No scleral icterus.       Right eye: No discharge.        Left eye: No discharge.     Conjunctiva/sclera: Conjunctivae normal.  Cardiovascular:     Rate and Rhythm: Normal rate and regular rhythm.  Pulmonary:     Effort: Pulmonary effort is normal. No respiratory distress.     Breath sounds: Normal breath sounds.  Abdominal:     General: Bowel sounds are normal.     Palpations: Abdomen is soft.     Tenderness: There is no abdominal tenderness.  Musculoskeletal:        General: No swelling or tenderness.     Cervical back: Neck supple. No tenderness.  Lymphadenopathy:     Cervical: No cervical adenopathy.  Skin:    Findings: No erythema or rash.  Neurological:     Mental Status: He is alert.  Psychiatric:        Mood and Affect: Mood normal.        Behavior: Behavior normal.         Outpatient Encounter Medications as of 10/18/2024  Medication Sig   rosuvastatin  (CRESTOR ) 40 MG tablet Take 1 tablet (40 mg total) by mouth daily.   [DISCONTINUED] acetaminophen  (TYLENOL ) 500 MG tablet Take 2 tablets (1,000 mg total) by mouth every 8 (eight) hours.   [DISCONTINUED] chlorhexidine  (HIBICLENS ) 4 %  external liquid Apply 15 mLs (1 Application total) topically as directed for 30 doses. Use as directed daily for 5 days every other week for 6 weeks.   [DISCONTINUED] docusate sodium  (COLACE) 100 MG capsule Take 1 capsule (100 mg total) by mouth 2 (two) times daily.   [DISCONTINUED] ondansetron  (ZOFRAN ) 4 MG tablet Take 1 tablet (4 mg total) by mouth every 6 (six) hours as needed for nausea.   [DISCONTINUED] oxyCODONE  (ROXICODONE ) 5 MG immediate release tablet Take 0.5-1 tablets (2.5-5 mg total) by mouth every 8 (eight) hours as needed for breakthrough pain.   [DISCONTINUED] rosuvastatin  (CRESTOR ) 40 MG tablet TAKE 1 TABLET BY MOUTH EVERY DAY   [DISCONTINUED] traMADol  (ULTRAM ) 50 MG tablet Take 1 tablet (50 mg total) by mouth every 6 (six) hours as needed for moderate pain (pain score 4-6).   No facility-administered encounter medications on file as of 10/18/2024.     Lab Results  Component Value Date   WBC 6.3 10/18/2024   HGB 15.1 10/18/2024   HCT 43.9 10/18/2024   PLT 264.0 10/18/2024   GLUCOSE 116 (H) 10/18/2024   CHOL 156 10/18/2024   TRIG 98.0 10/18/2024   HDL 52.20 10/18/2024   LDLCALC 84 10/18/2024   ALT 33 10/18/2024   AST 20 10/18/2024   NA 139 10/18/2024   K 4.4 10/18/2024   CL 103 10/18/2024   CREATININE 0.80 10/18/2024   BUN 13 10/18/2024   CO2 28 10/18/2024   TSH 1.040 04/18/2024   PSA 0.30 01/23/2023   HGBA1C 5.9 10/18/2024    DG HIP UNILAT W OR W/O PELVIS 2-3 VIEWS RIGHT Result Date: 05/19/2024 CLINICAL DATA:  Right total hip arthroplasty intraoperative imaging EXAM: DG HIP (WITH OR WITHOUT PELVIS) 2-3V RIGHT COMPARISON:  Radiographs 11/06/2023 FINDINGS: Fluoroscopic spot images demonstrate right total hip prosthesis in place with expected alignment. No periprosthetic fracture or acute complicating feature observed. IMPRESSION: 1. Right total hip prosthesis in place; no complicating features observed. Electronically Signed   By: Ryan Salvage M.D.   On:  05/19/2024 12:54   DG C-Arm 1-60 Min-No Report Result Date: 05/19/2024 Fluoroscopy was utilized by the requesting physician.  No radiographic interpretation.   DG C-Arm 1-60 Min-No Report Result Date: 05/19/2024 Fluoroscopy was utilized by the requesting physician.  No radiographic interpretation.       Assessment & Plan:  Leukocytosis, unspecified type Assessment & Plan: Recent elevated white blood cell count. Recheck cbc to confirm wnl.   Orders: -     CBC with Differential/Platelet  Hypercholesterolemia Assessment & Plan: Continue crestor . Check lipid panel and liver function tests today.  Low cholesterol diet and exercise.   Orders: -     Basic metabolic panel with GFR -     Hepatic function panel -     Lipid panel -     Varicella zoster antibody, IgG  Hyperglycemia Assessment & Plan: Low carb diet and exercise. Check met b and A1c.   Orders: -     Basic metabolic panel with GFR -     Hemoglobin A1c -     Varicella zoster antibody, IgG  S/P total right hip arthroplasty Assessment & Plan: Doing well s/p surgery. Continue exercise/stretches.    Pulmonary nodules Assessment & Plan: Cardiac scoring 11/2021.  Pulmonary nodules.  Schedule f/u CT chest in one year. Saw cardiology 12/2023 - recommended f/u CT chest in 12-18 months.    Colon cancer screening Assessment & Plan: Colonoscopy 01/22/22 -  Few polyps removed.  Tubular adenomas.  F/u colonoscopy 3 years.    Other orders -     Rosuvastatin  Calcium ; Take 1 tablet (40 mg total) by mouth daily.  Dispense: 90 tablet; Refill: 1     Allena Hamilton, MD "

## 2024-10-19 ENCOUNTER — Ambulatory Visit: Payer: Self-pay | Admitting: Internal Medicine

## 2024-10-19 LAB — VARICELLA ZOSTER ANTIBODY, IGG: Varicella IgG: <1 {s_co_ratio} — ABNORMAL LOW

## 2024-10-28 ENCOUNTER — Encounter: Payer: Self-pay | Admitting: Internal Medicine

## 2024-10-28 NOTE — Assessment & Plan Note (Signed)
 Continue crestor . Check lipid panel and liver function tests today. Low cholesterol diet and exercise.

## 2024-10-28 NOTE — Assessment & Plan Note (Signed)
 Low carb diet and exercise. Check met b and A1c.

## 2024-10-28 NOTE — Assessment & Plan Note (Signed)
 Doing well s/p surgery. Continue exercise/stretches.

## 2024-10-28 NOTE — Assessment & Plan Note (Signed)
 Recent elevated white blood cell count. Recheck cbc to confirm wnl.

## 2024-10-28 NOTE — Assessment & Plan Note (Signed)
 Cardiac scoring 11/2021.  Pulmonary nodules.  Schedule f/u CT chest in one year. Saw cardiology 12/2023 - recommended f/u CT chest in 12-18 months.

## 2024-10-28 NOTE — Assessment & Plan Note (Signed)
Colonoscopy 01/22/22 -  Few polyps removed.  Tubular adenomas.  F/u colonoscopy 3 years.  

## 2024-12-08 ENCOUNTER — Ambulatory Visit: Admission: RE | Admit: 2024-12-08 | Discharge: 2024-12-08 | Attending: Pulmonary Disease | Admitting: Pulmonary Disease

## 2024-12-08 DIAGNOSIS — R918 Other nonspecific abnormal finding of lung field: Secondary | ICD-10-CM
